# Patient Record
Sex: Female | Born: 1975 | Race: Black or African American | Hispanic: No | Marital: Married | Smoking: Never smoker
Health system: Southern US, Community
[De-identification: ages and names within clinical notes are randomized; demographics above are authoritative.]

## PROBLEM LIST (undated history)

## (undated) DIAGNOSIS — T7840XA Allergy, unspecified, initial encounter: Secondary | ICD-10-CM

## (undated) HISTORY — DX: Allergy, unspecified, initial encounter: T78.40XA

---

## 2001-05-31 ENCOUNTER — Ambulatory Visit (HOSPITAL_COMMUNITY): Admission: RE | Admit: 2001-05-31 | Discharge: 2001-05-31 | Payer: Self-pay | Admitting: Obstetrics

## 2001-07-27 ENCOUNTER — Ambulatory Visit (HOSPITAL_COMMUNITY): Admission: RE | Admit: 2001-07-27 | Discharge: 2001-07-27 | Payer: Self-pay | Admitting: *Deleted

## 2001-07-27 ENCOUNTER — Encounter: Payer: Self-pay | Admitting: *Deleted

## 2001-10-15 ENCOUNTER — Encounter (INDEPENDENT_AMBULATORY_CARE_PROVIDER_SITE_OTHER): Payer: Self-pay | Admitting: *Deleted

## 2001-10-15 ENCOUNTER — Inpatient Hospital Stay (HOSPITAL_COMMUNITY): Admission: AD | Admit: 2001-10-15 | Discharge: 2001-10-17 | Payer: Self-pay | Admitting: *Deleted

## 2006-02-20 ENCOUNTER — Ambulatory Visit (HOSPITAL_COMMUNITY): Admission: RE | Admit: 2006-02-20 | Discharge: 2006-02-20 | Payer: Self-pay | Admitting: Obstetrics & Gynecology

## 2006-03-06 ENCOUNTER — Ambulatory Visit (HOSPITAL_COMMUNITY): Admission: RE | Admit: 2006-03-06 | Discharge: 2006-03-06 | Payer: Self-pay | Admitting: Obstetrics & Gynecology

## 2006-08-06 ENCOUNTER — Ambulatory Visit: Payer: Self-pay | Admitting: *Deleted

## 2006-08-06 ENCOUNTER — Inpatient Hospital Stay (HOSPITAL_COMMUNITY): Admission: AD | Admit: 2006-08-06 | Discharge: 2006-08-08 | Payer: Self-pay | Admitting: Obstetrics and Gynecology

## 2006-08-14 ENCOUNTER — Ambulatory Visit: Payer: Self-pay | Admitting: *Deleted

## 2006-08-14 ENCOUNTER — Inpatient Hospital Stay (HOSPITAL_COMMUNITY): Admission: AD | Admit: 2006-08-14 | Discharge: 2006-08-14 | Payer: Self-pay | Admitting: Obstetrics & Gynecology

## 2006-08-17 ENCOUNTER — Inpatient Hospital Stay (HOSPITAL_COMMUNITY): Admission: AD | Admit: 2006-08-17 | Discharge: 2006-08-18 | Payer: Self-pay | Admitting: Obstetrics & Gynecology

## 2006-08-17 ENCOUNTER — Ambulatory Visit: Payer: Self-pay | Admitting: Obstetrics and Gynecology

## 2007-07-30 ENCOUNTER — Emergency Department (HOSPITAL_COMMUNITY): Admission: EM | Admit: 2007-07-30 | Discharge: 2007-07-30 | Payer: Self-pay | Admitting: Family Medicine

## 2007-08-02 ENCOUNTER — Ambulatory Visit: Payer: Self-pay | Admitting: *Deleted

## 2007-09-01 ENCOUNTER — Encounter (INDEPENDENT_AMBULATORY_CARE_PROVIDER_SITE_OTHER): Payer: Self-pay | Admitting: Nurse Practitioner

## 2007-09-01 ENCOUNTER — Ambulatory Visit: Payer: Self-pay | Admitting: Internal Medicine

## 2007-09-01 LAB — CONVERTED CEMR LAB
ALT: <8 units/L (ref 0–35)
AST: 17 units/L (ref 0–37)
Albumin: 4.3 g/dL (ref 3.5–5.2)
Alkaline Phosphatase: 73 units/L (ref 39–117)
BUN: 8 mg/dL (ref 6–23)
Basophils Absolute: 0 10*3/uL (ref 0.0–0.1)
Basophils Relative: 0 % (ref 0–1)
CO2: 23 meq/L (ref 19–32)
Calcium: 8.9 mg/dL (ref 8.4–10.5)
Chloride: 101 meq/L (ref 96–112)
Creatinine, Ser: 0.61 mg/dL (ref 0.40–1.20)
Eosinophils Absolute: 0.1 10*3/uL (ref 0.0–0.7)
Eosinophils Relative: 1 % (ref 0–5)
Glucose, Bld: 84 mg/dL (ref 70–99)
HCT: 42.3 % (ref 36.0–46.0)
Hemoglobin: 13.9 g/dL (ref 12.0–15.0)
Lymphocytes Relative: 50 % — ABNORMAL HIGH (ref 12–46)
Lymphs Abs: 3.1 10*3/uL (ref 0.7–4.0)
MCHC: 32.9 g/dL (ref 30.0–36.0)
MCV: 87.4 fL (ref 78.0–100.0)
Monocytes Absolute: 0.5 10*3/uL (ref 0.1–1.0)
Monocytes Relative: 7 % (ref 3–12)
Neutro Abs: 2.6 10*3/uL (ref 1.7–7.7)
Neutrophils Relative %: 42 % — ABNORMAL LOW (ref 43–77)
Platelets: 267 10*3/uL (ref 150–400)
Potassium: 3.9 meq/L (ref 3.5–5.3)
RBC: 4.84 M/uL (ref 3.87–5.11)
RDW: 13.8 % (ref 11.5–15.5)
Sodium: 138 meq/L (ref 135–145)
TSH: 0.747 microintl units/mL (ref 0.350–5.50)
Total Bilirubin: 0.8 mg/dL (ref 0.3–1.2)
Total Protein: 7.8 g/dL (ref 6.0–8.3)
WBC: 6.2 10*3/uL (ref 4.0–10.5)

## 2008-06-04 ENCOUNTER — Emergency Department (HOSPITAL_COMMUNITY): Admission: EM | Admit: 2008-06-04 | Discharge: 2008-06-04 | Payer: Self-pay | Admitting: Family Medicine

## 2010-06-20 ENCOUNTER — Inpatient Hospital Stay (INDEPENDENT_AMBULATORY_CARE_PROVIDER_SITE_OTHER)
Admission: RE | Admit: 2010-06-20 | Discharge: 2010-06-20 | Disposition: A | Discharge status: Home / Self Care | Payer: Self-pay | Source: Ambulatory Visit | Attending: Family Medicine | Admitting: Family Medicine

## 2010-06-20 DIAGNOSIS — I889 Nonspecific lymphadenitis, unspecified: Secondary | ICD-10-CM

## 2010-06-20 LAB — DIFFERENTIAL
Eosinophils Absolute: 0.2 10*3/uL (ref 0.0–0.7)
Eosinophils Relative: 2 % (ref 0–5)
Lymphs Abs: 3.7 10*3/uL (ref 0.7–4.0)
Monocytes Relative: 6 % (ref 3–12)

## 2010-06-20 LAB — CBC
HCT: 40.5 % (ref 36.0–46.0)
MCH: 28.7 pg (ref 26.0–34.0)
MCV: 87.3 fL (ref 78.0–100.0)
Platelets: 283 10*3/uL (ref 150–400)
RDW: 13.9 % (ref 11.5–15.5)

## 2010-06-20 LAB — POCT I-STAT, CHEM 8
BUN: 10 mg/dL (ref 6–23)
Calcium, Ion: 1.14 mmol/L (ref 1.12–1.32)
Chloride: 105 mEq/L (ref 96–112)
Creatinine, Ser: 0.7 mg/dL (ref 0.4–1.2)
Glucose, Bld: 80 mg/dL (ref 70–99)
HCT: 42 % (ref 36.0–46.0)
Hemoglobin: 14.3 g/dL (ref 12.0–15.0)
Potassium: 4 mEq/L (ref 3.5–5.1)
Sodium: 139 mEq/L (ref 135–145)
TCO2: 22 mmol/L (ref 0–100)

## 2011-04-04 ENCOUNTER — Emergency Department (INDEPENDENT_AMBULATORY_CARE_PROVIDER_SITE_OTHER)
Admission: EM | Admit: 2011-04-04 | Discharge: 2011-04-04 | Disposition: A | Discharge status: Home / Self Care | Payer: Self-pay | Source: Home / Self Care | Attending: Family Medicine | Admitting: Family Medicine

## 2011-04-04 DIAGNOSIS — L723 Sebaceous cyst: Secondary | ICD-10-CM

## 2011-04-04 NOTE — ED Notes (Signed)
C/o "bump" to lower abdomen for a year or more- states last night it started draining.  Denies pain or fever

## 2011-04-04 NOTE — ED Provider Notes (Signed)
History     CSN: [id number] Arrival date & time: 04/04/2011 10:49 AM   First MD Initiated Contact with Patient 04/04/11 1017      Chief Complaint  Patient presents with  . Rash    (Consider location/radiation/quality/duration/timing/severity/associated sxs/prior treatment) HPI Comments: Meghan [name] presents for evaluation of a "bump" on her lower abdomen, that began oozing brownish material last night. She reports that it has been there for a year or more. It is mildly tender. She denies any injury.  Patient is a 35 y.o. female presenting with rash.  Rash  This is a chronic problem. The current episode started more than 1 week ago. The problem has not changed since onset.The problem is associated with nothing. There has been no fever. The rash is present on the abdomen. The patient is experiencing no pain. She has tried nothing for the symptoms.    History reviewed. No pertinent past medical history.  History reviewed. No pertinent past surgical history.  No family history on file.  History  Substance Use Topics  . Smoking status: Never Smoker   . Smokeless tobacco: Not on file  . Alcohol Use: No    OB History    Grav Para Term Preterm Abortions TAB SAB Ect Mult Living                  Review of Systems  Constitutional: Negative.   HENT: Negative.   Eyes: Negative.   Respiratory: Negative.   Cardiovascular: Negative.   Gastrointestinal: Negative.   Genitourinary: Negative.   Musculoskeletal: Negative.   Skin: Negative for rash and wound.       Small 2 cm indurated lesion over lower abdomen with central closed comedone  Neurological: Negative.     Allergies  Review of patient's allergies indicates no known allergies.  Home Medications   Current Outpatient Rx  Name Route Sig Dispense Refill  . PRESCRIPTION MEDICATION  Birth Control Pills       BP 115/78  Pulse 78  Temp(Src) 98.8 F (37.1 C) (Oral)  Resp 16  SpO2 98%  LMP 03/20/2011  Physical Exam    Nursing note and vitals reviewed. Constitutional: She is oriented to person, place, and time. She appears well-developed and well-nourished.  HENT:  Head: Normocephalic and atraumatic.  Eyes: EOM are normal.  Neck: Normal range of motion.  Abdominal: Soft. Normal appearance and bowel sounds are normal. There is no tenderness.    Neurological: She is alert and oriented to person, place, and time.  Skin: Skin is warm and dry. Lesion and rash noted. Rash is papular.       ED Course  Procedures (including critical care time)  Labs Reviewed - No data to display No results found.   1. Sebaceous cyst       MDM          Richardo Priest, MD 04/04/11 1154

## 2012-04-22 ENCOUNTER — Encounter (HOSPITAL_COMMUNITY): Payer: Self-pay | Admitting: Emergency Medicine

## 2012-04-22 ENCOUNTER — Emergency Department (INDEPENDENT_AMBULATORY_CARE_PROVIDER_SITE_OTHER)
Admission: EM | Admit: 2012-04-22 | Discharge: 2012-04-22 | Disposition: A | Discharge status: Home / Self Care | Payer: Self-pay | Source: Home / Self Care

## 2012-04-22 DIAGNOSIS — L259 Unspecified contact dermatitis, unspecified cause: Secondary | ICD-10-CM

## 2012-04-22 DIAGNOSIS — L309 Dermatitis, unspecified: Secondary | ICD-10-CM

## 2012-04-22 MED ORDER — TRIAMCINOLONE ACETONIDE 0.1 % EX CREA: TOPICAL_CREAM | Freq: Two times a day (BID) | CUTANEOUS | Status: DC

## 2012-04-22 MED ORDER — CLINDAMYCIN PHOSPHATE 1 % EX GEL: Freq: Two times a day (BID) | CUTANEOUS | Status: DC

## 2012-04-22 NOTE — ED Provider Notes (Signed)
Medical screening examination/treatment/procedure(s) were performed by non-physician practitioner and as supervising physician I was immediately available for consultation/collaboration.   MORENO-COLL,Jalah Warmuth; MD   Zaylia Riolo Moreno-Coll, MD 04/22/12 1513 

## 2012-04-22 NOTE — ED Provider Notes (Signed)
History     CSN: [id number]  Arrival date & time 04/22/12  1133   None     Chief Complaint  Patient presents with  . Fever  . Rash    (Consider location/radiation/quality/duration/timing/severity/associated sxs/prior treatment) HPI Comments: This 36 year old Arabic woman presents with a rash on her face that developed last week. She states this rash is tingling, burning and sensitive. It is not located in other areas besides the face. The rash is  reddish papular and distributed about the face and adjacent to the lower eyelashes. 2 days ago she had a low-grade fever but has not had one since. She denies earache or sore throat. Denies shortness of breath, GI or GU symptoms.  Patient is a 36 y.o. female presenting with fever and rash.  Fever Primary symptoms of the febrile illness include fever and rash. Primary symptoms do not include fatigue.  Rash     History reviewed. No pertinent past medical history.  History reviewed. No pertinent past surgical history.  History reviewed. No pertinent family history.  History  Substance Use Topics  . Smoking status: Never Smoker   . Smokeless tobacco: Not on file  . Alcohol Use: No    OB History    Grav Para Term Preterm Abortions TAB SAB Ect Mult Living                  Review of Systems  Constitutional: Positive for fever. Negative for chills, activity change and fatigue.  HENT: Negative for ear pain, sore throat, facial swelling, rhinorrhea, neck pain and postnasal drip.   Gastrointestinal: Negative.   Genitourinary: Negative.   Skin: Positive for rash.       The history of present illness    Allergies  Review of patient's allergies indicates no known allergies.  Home Medications   Current Outpatient Rx  Name  Route  Sig  Dispense  Refill  . CLINDAMYCIN PHOSPHATE 1 % EX GEL   Topical   Apply topically 2 (two) times daily.   30 g   0   . PRESCRIPTION MEDICATION      Birth Control Pills          .  TRIAMCINOLONE ACETONIDE 0.1 % EX CREA   Topical   Apply topically 2 (two) times daily. Apply for 2 weeks. May use on face   30 g   0     BP 123/72  Pulse 58  Temp 98 F (36.7 C) (Oral)  Resp 16  SpO2 98%  Physical Exam  Nursing note and vitals reviewed. Constitutional: She is oriented to person, place, and time. She appears well-developed and well-nourished. No distress.  HENT:       Bilateral TMs are normal Oropharynx is moist and clear with no exudates, erythema or PND .  Eyes: Conjunctivae normal are normal.       As stated in history of present illness there are small papules bordering the lower eyelashes.  Neck: Normal range of motion. Neck supple.  Cardiovascular: Normal rate and normal heart sounds.   Pulmonary/Chest: Effort normal.  Neurological: She is alert and oriented to person, place, and time.  Skin: Skin is warm and dry. Rash noted.  Psychiatric: She has a normal mood and affect.    ED Course  Procedures (including critical care time)  Labs Reviewed - No data to display No results found.   1. Dermatitis   2. Eczema of face       MDM   This  type of rash in distribution appears most like eczema. There are no systemic symptoms to suggest infection. Another consideration is that of an acne type rash possibly superimposed therefore I did had the clindamycin gel. Triamcinolone 0.1% twice a day to the face. Cool compresses and instructions on care and treatment of eczema toward rashes. Recheck promptly for any new symptoms problems or worsening patient is stable and         Hayden Rasmussen, NP 04/22/12 1357

## 2012-04-22 NOTE — ED Notes (Addendum)
Reports fever and rash on face.  Rash has been on face for a couple of days.  No new product use.  No medication or creams used.  The rash does itch.

## 2013-06-16 ENCOUNTER — Ambulatory Visit (INDEPENDENT_AMBULATORY_CARE_PROVIDER_SITE_OTHER): Payer: No Typology Code available for payment source | Admitting: Family Medicine

## 2013-06-16 VITALS — BP 106/62 | HR 76 | Temp 97.9°F | Resp 16 | Ht 64.0 in | Wt 145.0 lb

## 2013-06-16 DIAGNOSIS — Z0000 Encounter for general adult medical examination without abnormal findings: Secondary | ICD-10-CM

## 2013-06-16 DIAGNOSIS — Z23 Encounter for immunization: Secondary | ICD-10-CM

## 2013-06-16 LAB — LIPID PANEL
CHOL/HDL RATIO: 3.3 ratio
CHOLESTEROL: 157 mg/dL (ref 0–200)
HDL: 47 mg/dL (ref >39)
LDL Cholesterol: 91 mg/dL (ref 0–99)
Triglycerides: 95 mg/dL (ref <150)
VLDL: 19 mg/dL (ref 0–40)

## 2013-06-16 LAB — COMPREHENSIVE METABOLIC PANEL WITH GFR
ALT: 8 U/L (ref 0–35)
AST: 13 U/L (ref 0–37)
Albumin: 3.8 g/dL (ref 3.5–5.2)
Alkaline Phosphatase: 52 U/L (ref 39–117)
BUN: 8 mg/dL (ref 6–23)
CO2: 26 meq/L (ref 19–32)
Calcium: 8.7 mg/dL (ref 8.4–10.5)
Chloride: 103 meq/L (ref 96–112)
Creat: 0.61 mg/dL (ref 0.50–1.10)
Glucose, Bld: 68 mg/dL — ABNORMAL LOW (ref 70–99)
Potassium: 4 meq/L (ref 3.5–5.3)
Sodium: 138 meq/L (ref 135–145)
Total Bilirubin: 0.6 mg/dL (ref 0.2–1.2)
Total Protein: 7.2 g/dL (ref 6.0–8.3)

## 2013-06-16 LAB — CBC
HCT: 38.5 % (ref 36.0–46.0)
HEMOGLOBIN: 13.2 g/dL (ref 12.0–15.0)
MCH: 28.9 pg (ref 26.0–34.0)
MCHC: 34.3 g/dL (ref 30.0–36.0)
MCV: 84.4 fL (ref 78.0–100.0)
PLATELETS: 332 10*3/uL (ref 150–400)
RBC: 4.56 MIL/uL (ref 3.87–5.11)
RDW: 13.8 % (ref 11.5–15.5)
WBC: 6.2 10*3/uL (ref 4.0–10.5)

## 2013-06-16 NOTE — Patient Instructions (Signed)
Good to see you today- I will be in touch regarding your labs when they come in.  It is time for your first mammogram after you turn 38- this can be done at several locations in town at your convenience.  Remember to have a pap in the next year.    Try the [facility] clinic for help with your veins Address: [address] [place], [place] [address] Telephone: [place] [phone]

## 2013-06-16 NOTE — Progress Notes (Signed)
[facility] [address], [place] [address] [phone]  Date:  06/16/2013   Name:  Meghan [name]   DOB:  05/16/1975   MRN:  [id number]  PCP:  Per Patient No Pcp    Chief Complaint: Annual Exam   History of Present Illness:  Meghan [name] is a 38 y.o. very pleasant female patient who presents with the following:  Here as a new patient today for an annual exam.  She needs this for her insurance.  She is not aware of any health problems. She is fasting today for labs.  She did have a pap in 2013 at another clinic. She does not wish to have a pap today  Sometiems she may have a pain in her legs-  She has noted this for some time and relates it to spider veins in her legs  She is on OCP and uses them continuously; unsure the exact date of her last menses  There are no active problems to display for this patient.   No past medical history on file.  No past surgical history on file.  History  Substance Use Topics  . Smoking status: Never Smoker   . Smokeless tobacco: Not on file  . Alcohol Use: No    No family history on file.  No Known Allergies  Medication list has been reviewed and updated.  Current Outpatient Prescriptions on File Prior to Visit  Medication Sig Dispense Refill  . PRESCRIPTION MEDICATION Birth Control Pills       . clindamycin (CLINDAGEL) 1 % gel Apply topically 2 (two) times daily.  30 g  0  . triamcinolone cream (KENALOG) 0.1 % Apply topically 2 (two) times daily. Apply for 2 weeks. May use on face  30 g  0   No current facility-administered medications on file prior to visit.    Review of Systems:  As per HPI- otherwise negative.   Physical Examination: Filed Vitals:   06/16/13 1211  BP: 106/62  Pulse: 76  Temp: 97.9 F (36.6 C)  Resp: 16   Filed Vitals:   06/16/13 1211  Height: 5\' 4"  (1.626 m)  Weight: 145 lb (65.772 kg)   Body mass index is 24.88 kg/(m^2). Ideal Body Weight: Weight in (lb) to  have BMI = 25: 145.3  GEN: WDWN, NAD, Non-toxic, A & O x 3, looks well HEENT: Atraumatic, Normocephalic. Neck supple. No masses, No LAD.  Bilateral TM wnl, oropharynx normal.  PEERL,EOMI.  Ears and Nose: No external deformity. CV: RRR, No M/G/R. No JVD. No thrill. No extra heart sounds. PULM: CTA B, no wheezes, crackles, rhonchi. No retractions. No resp. distress. No accessory muscle use. ABD: S, NT, ND. No rebound. No HSM. EXTR: No c/c/e NEURO Normal gait.  PSYCH: Normally interactive. Conversant. Not depressed or anxious appearing.  Calm demeanor.    Assessment and Plan: Physical exam - Plan: CBC, Comprehensive metabolic panel, Lipid panel, Tdap vaccine greater than or equal to 7yo IM, TSH  CPE for insurance today.  Labs pending as above- Will plan further follow- up pending labs. tdap today See patient instructions for more details.     Signed Abbe AmsterdamJessica Copland, MD

## 2013-06-17 ENCOUNTER — Encounter: Payer: Self-pay | Admitting: Family Medicine

## 2013-06-17 LAB — TSH: TSH: 0.526 u[IU]/mL (ref 0.350–4.500)

## 2014-04-19 ENCOUNTER — Ambulatory Visit (INDEPENDENT_AMBULATORY_CARE_PROVIDER_SITE_OTHER): Payer: No Typology Code available for payment source | Admitting: Emergency Medicine

## 2014-04-19 VITALS — BP 106/68 | HR 74 | Temp 98.3°F | Resp 16 | Ht 63.75 in | Wt 153.4 lb

## 2014-04-19 DIAGNOSIS — H0013 Chalazion right eye, unspecified eyelid: Secondary | ICD-10-CM

## 2014-04-19 DIAGNOSIS — J0140 Acute pansinusitis, unspecified: Secondary | ICD-10-CM

## 2014-04-19 MED ORDER — AMOXICILLIN 875 MG PO TABS: 875.0000 mg | ORAL_TABLET | Freq: Two times a day (BID) | ORAL | Status: DC

## 2014-04-19 MED ORDER — SULFACETAMIDE SODIUM 10 % OP SOLN: 1.0000 [drp] | OPHTHALMIC | Status: DC

## 2014-04-19 MED ORDER — PSEUDOEPHEDRINE-GUAIFENESIN ER 60-600 MG PO TB12: 1.0000 | ORAL_TABLET | Freq: Two times a day (BID) | ORAL | Status: DC

## 2014-04-19 NOTE — Patient Instructions (Signed)

## 2014-04-19 NOTE — Progress Notes (Signed)
[facility] [address], [place] [address] [phone]  Date:  04/19/2014   Name:  Meghan Altesahani H Hawks   DOB:  06/02/1975   MRN:  [id number]  PCP:  Per Patient No Pcp    Chief Complaint: Nasal Congestion; Facial Pain; and Eye Pain   History of Present Illness:  Meghan [name] is a 38 y.o. very pleasant female patient who presents with the following:  Has a pressure in her maxillary sinuses for a week.  No drainage but post nasal draiange that she does not view No sore throat. Cough with no sputum production, wheezing or shortness of breath No fever or chills Says congestion is causing her to awaken in the night due to an inability to breath. No improvement with over the counter medications or other home remedies.  Denies other complaint or health concern today.    There are no active problems to display for this patient.   History reviewed. No pertinent past medical history.  History reviewed. No pertinent past surgical history.  History  Substance Use Topics  . Smoking status: Never Smoker   . Smokeless tobacco: Not on file  . Alcohol Use: No    History reviewed. No pertinent family history.  No Known Allergies  Medication list has been reviewed and updated.  Current Outpatient Prescriptions on File Prior to Visit  Medication Sig Dispense Refill  . PRESCRIPTION MEDICATION Birth Control Pills     . triamcinolone cream (KENALOG) 0.1 % Apply topically 2 (two) times daily. Apply for 2 weeks. May use on face (Patient not taking: Reported on 04/19/2014) 30 g 0   No current facility-administered medications on file prior to visit.    Review of Systems:  As per HPI, otherwise negative.    Physical Examination: Filed Vitals:   04/19/14 1322  BP: 106/68  Pulse: 74  Temp: 98.3 F (36.8 C)  Resp: 16   Filed Vitals:   04/19/14 1322  Height: 5' 3.75" (1.619 m)  Weight: 153 lb 6.4 oz (69.582 kg)   Body mass index is 26.55  kg/(m^2). Ideal Body Weight: Weight in (lb) to have BMI = 25: 144.2  GEN: WDWN, NAD, Non-toxic, A & O x 3 HEENT: Atraumatic, Normocephalic. Neck supple. No masses, No LAD. Ears and Nose: No external deformity. CV: RRR, No M/G/R. No JVD. No thrill. No extra heart sounds. PULM: CTA B, no wheezes, crackles, rhonchi. No retractions. No resp. distress. No accessory muscle use. ABD: S, NT, ND, +BS. No rebound. No HSM. EXTR: No c/c/e NEURO Normal gait.  PSYCH: Normally interactive. Conversant. Not depressed or anxious appearing.  Calm demeanor.    Assessment and Plan: Sinusitis amox mucinex d   Signed,  Phillips OdorJeffery Kiandra Sanguinetti, MD

## 2014-06-02 ENCOUNTER — Other Ambulatory Visit: Payer: Self-pay

## 2014-06-02 NOTE — Telephone Encounter (Signed)
Dr Patsy Lageropland, you saw pt 06/2013 for a CPE and discussed this, but we haven't Rxd for pt before that I can see. Do you want to give her any RFs? I have pended it for 1 mos w/note to RTC. I don't know why it is the med list the way it is, as "Prescripttion Medication, Birth Control Pills"? I called her pharm and they filled it with "Aranelle". I have pended it with this brand, sig was take 1 daily.

## 2014-06-02 NOTE — Telephone Encounter (Signed)
Pt calling to request a refill on birth control. CB # [phone]

## 2014-06-03 MED ORDER — NORETHIN-ETH ESTRAD TRIPHASIC 0.5/1/0.5-35 MG-MCG PO TABS: 1.0000 | ORAL_TABLET | Freq: Every day | ORAL | Status: DC

## 2014-07-14 ENCOUNTER — Ambulatory Visit (INDEPENDENT_AMBULATORY_CARE_PROVIDER_SITE_OTHER): Payer: 59 | Admitting: Emergency Medicine

## 2014-07-14 ENCOUNTER — Other Ambulatory Visit: Payer: Self-pay | Admitting: Family Medicine

## 2014-07-14 VITALS — BP 110/70 | HR 88 | Temp 98.3°F | Resp 16 | Ht 64.0 in | Wt 157.0 lb

## 2014-07-14 DIAGNOSIS — Z3041 Encounter for surveillance of contraceptive pills: Secondary | ICD-10-CM | POA: Diagnosis not present

## 2014-07-14 MED ORDER — NORETHIN-ETH ESTRAD TRIPHASIC 0.5/1/0.5-35 MG-MCG PO TABS: 1.0000 | ORAL_TABLET | Freq: Every day | ORAL | Status: DC

## 2014-07-14 NOTE — Progress Notes (Signed)
[facility] [address], [place] [address] [phone]- [phone]  Date:  07/14/2014   Name:  Meghan [name]   DOB:  08/21/1975   MRN:  [id number]  PCP:  Per Patient No Pcp    Chief Complaint: Medication Refill   History of Present Illness:  Meghan [name] is a 39 y.o. very pleasant female patient who presents with the following:  Comes for renewal of her OCP. Has tolerated the medication well with no adverse effects. Currently on menses.   Denies other complaint or health concern today.    There are no active problems to display for this patient.   History reviewed. No pertinent past medical history.  History reviewed. No pertinent past surgical history.  History  Substance Use Topics  . Smoking status: Never Smoker   . Smokeless tobacco: Not on file  . Alcohol Use: No    History reviewed. No pertinent family history.  No Known Allergies  Medication list has been reviewed and updated.  Current Outpatient Prescriptions on File Prior to Visit  Medication Sig Dispense Refill  . Norethindrone-Ethinyl Estradiol Triphasic (ARANELLE) 0.5/1/0.5-35 MG-MCG tablet Take 1 tablet by mouth daily. NO MORE REFILLS WITHOUT OFFICE VISIT - 2ND NOTICE 28 tablet 0  . sulfacetamide (BLEPH-10) 10 % ophthalmic solution Place 1 drop into the right eye every 4 (four) hours. 15 mL 0  . triamcinolone cream (KENALOG) 0.1 % Apply topically 2 (two) times daily. Apply for 2 weeks. May use on face (Patient not taking: Reported on 04/19/2014) 30 g 0   No current facility-administered medications on file prior to visit.    Review of Systems:  As per HPI, otherwise negative.    Physical Examination: Filed Vitals:   07/14/14 1734  BP: 110/70  Pulse: 88  Temp: 98.3 F (36.8 C)  Resp: 16   Filed Vitals:   07/14/14 1734  Height: 5\' 4"  (1.626 m)  Weight: 157 lb (71.215 kg)   Body mass index is 26.94 kg/(m^2). Ideal Body Weight: Weight in (lb) to have BMI = 25:  145.3   GEN: WDWN, NAD, Non-toxic, Alert & Oriented x 3 HEENT: Atraumatic, Normocephalic.  Ears and Nose: No external deformity. EXTR: No clubbing/cyanosis/edema NEURO: Normal gait.  PSYCH: Normally interactive. Conversant. Not depressed or anxious appearing.  Calm demeanor.  ABD:  Soft, not tender.  Assessment and Plan: OCP management Refill  Signed,  Phillips OdorJeffery Kealohilani Maiorino, MD

## 2014-07-14 NOTE — Patient Instructions (Signed)
Contraception Choices Contraception (birth control) is the use of any methods or devices to prevent pregnancy. Below are some methods to help avoid pregnancy. HORMONAL METHODS   Contraceptive implant. This is a thin, plastic tube containing progesterone hormone. It does not contain estrogen hormone. Your health care provider inserts the tube in the inner part of the upper arm. The tube can remain in place for up to 3 years. After 3 years, the implant must be removed. The implant prevents the ovaries from releasing an egg (ovulation), thickens the cervical mucus to prevent sperm from entering the uterus, and thins the lining of the inside of the uterus.  Progesterone-only injections. These injections are given every 3 months by your health care provider to prevent pregnancy. This synthetic progesterone hormone stops the ovaries from releasing eggs. It also thickens cervical mucus and changes the uterine lining. This makes it harder for sperm to survive in the uterus.  Birth control pills. These pills contain estrogen and progesterone hormone. They work by preventing the ovaries from releasing eggs (ovulation). They also cause the cervical mucus to thicken, preventing the sperm from entering the uterus. Birth control pills are prescribed by a health care provider.Birth control pills can also be used to treat heavy periods.  Minipill. This type of birth control pill contains only the progesterone hormone. They are taken every day of each month and must be prescribed by your health care provider.  Birth control patch. The patch contains hormones similar to those in birth control pills. It must be changed once a week and is prescribed by a health care provider.  Vaginal ring. The ring contains hormones similar to those in birth control pills. It is left in the vagina for 3 weeks, removed for 1 week, and then a new one is put back in place. The patient must be comfortable inserting and removing the ring  from the vagina.A health care provider's prescription is necessary.  Emergency contraception. Emergency contraceptives prevent pregnancy after unprotected sexual intercourse. This pill can be taken right after sex or up to 5 days after unprotected sex. It is most effective the sooner you take the pills after having sexual intercourse. Most emergency contraceptive pills are available without a prescription. Check with your pharmacist. Do not use emergency contraception as your only form of birth control. BARRIER METHODS   Female condom. This is a thin sheath (latex or rubber) that is worn over the penis during sexual intercourse. It can be used with spermicide to increase effectiveness.  Female condom. This is a soft, loose-fitting sheath that is put into the vagina before sexual intercourse.  Diaphragm. This is a soft, latex, dome-shaped barrier that must be fitted by a health care provider. It is inserted into the vagina, along with a spermicidal jelly. It is inserted before intercourse. The diaphragm should be left in the vagina for 6 to 8 hours after intercourse.  Cervical cap. This is a round, soft, latex or plastic cup that fits over the cervix and must be fitted by a health care provider. The cap can be left in place for up to 48 hours after intercourse.  Sponge. This is a soft, circular piece of polyurethane foam. The sponge has spermicide in it. It is inserted into the vagina after wetting it and before sexual intercourse.  Spermicides. These are chemicals that kill or block sperm from entering the cervix and uterus. They come in the form of creams, jellies, suppositories, foam, or tablets. They do not require a   prescription. They are inserted into the vagina with an applicator before having sexual intercourse. The process must be repeated every time you have sexual intercourse. INTRAUTERINE CONTRACEPTION  Intrauterine device (IUD). This is a T-shaped device that is put in a woman's uterus  during a menstrual period to prevent pregnancy. There are 2 types:  Copper IUD. This type of IUD is wrapped in copper wire and is placed inside the uterus. Copper makes the uterus and fallopian tubes produce a fluid that kills sperm. It can stay in place for 10 years.  Hormone IUD. This type of IUD contains the hormone progestin (synthetic progesterone). The hormone thickens the cervical mucus and prevents sperm from entering the uterus, and it also thins the uterine lining to prevent implantation of a fertilized egg. The hormone can weaken or kill the sperm that get into the uterus. It can stay in place for 3-5 years, depending on which type of IUD is used. PERMANENT METHODS OF CONTRACEPTION  Female tubal ligation. This is when the woman's fallopian tubes are surgically sealed, tied, or blocked to prevent the egg from traveling to the uterus.  Hysteroscopic sterilization. This involves placing a small coil or insert into each fallopian tube. Your doctor uses a technique called hysteroscopy to do the procedure. The device causes scar tissue to form. This results in permanent blockage of the fallopian tubes, so the sperm cannot fertilize the egg. It takes about 3 months after the procedure for the tubes to become blocked. You must use another form of birth control for these 3 months.  Female sterilization. This is when the female has the tubes that carry sperm tied off (vasectomy).This blocks sperm from entering the vagina during sexual intercourse. After the procedure, the man can still ejaculate fluid (semen). NATURAL PLANNING METHODS  Natural family planning. This is not having sexual intercourse or using a barrier method (condom, diaphragm, cervical cap) on days the woman could become pregnant.  Calendar method. This is keeping track of the length of each menstrual cycle and identifying when you are fertile.  Ovulation method. This is avoiding sexual intercourse during ovulation.  Symptothermal  method. This is avoiding sexual intercourse during ovulation, using a thermometer and ovulation symptoms.  Post-ovulation method. This is timing sexual intercourse after you have ovulated. Regardless of which type or method of contraception you choose, it is important that you use condoms to protect against the transmission of sexually transmitted infections (STIs). Talk with your health care provider about which form of contraception is most appropriate for you. Document Released: 04/21/2005 Document Revised: 04/26/2013 Document Reviewed: 10/14/2012 Exit[employer] Patient Information 2015 [employer]. This information is not intended to replace advice given to you by your health care provider. Make sure you discuss any questions you have with your health care provider.  

## 2014-08-07 ENCOUNTER — Other Ambulatory Visit: Payer: Self-pay | Admitting: Family Medicine

## 2014-08-13 ENCOUNTER — Other Ambulatory Visit: Payer: Self-pay | Admitting: Family Medicine

## 2014-08-13 ENCOUNTER — Telehealth: Payer: Self-pay

## 2014-08-13 MED ORDER — NORETHIN-ETH ESTRAD TRIPHASIC 0.5/1/0.5-35 MG-MCG PO TABS: 1.0000 | ORAL_TABLET | Freq: Every day | ORAL | Status: DC

## 2014-08-13 NOTE — Addendum Note (Signed)
Addended by: Isaac BlissGALLOWAY, Juley Giovanetti J on: 08/13/2014 04:04 PM   Modules accepted: Orders

## 2014-08-13 NOTE — Telephone Encounter (Signed)
Patient's husband called again. First message documented at 12:57pm and second message documented at 2:31pm. Calling about a refill on wife's birth control medicine. Please advise[facility] [place]  [phone]

## 2014-08-13 NOTE — Telephone Encounter (Signed)
Patient's husband called to request a refill of her birth control.  I told him that a notice was sent stating that it requires her to have an office visit for refills.  He said she had an office visit on 07/14/14 and feels that that OV should serve as authorization for the refill.  Please advise.  Thank you.  CB#: [phone]

## 2014-08-13 NOTE — Telephone Encounter (Signed)
Husband called again at 3:48pm to check the status of this medication.

## 2014-08-13 NOTE — Telephone Encounter (Signed)
Called pharmacy to verify the Rx order.  [facility] stated they did not have the Rx on file.  I advised them according to our computer it stated it was received.  However, an Rx was sent over to the pharmacy and the pt was called back and advised the pharmacy now has their medication.

## 2015-03-22 ENCOUNTER — Ambulatory Visit (INDEPENDENT_AMBULATORY_CARE_PROVIDER_SITE_OTHER): Payer: 59 | Admitting: Family Medicine

## 2015-03-22 ENCOUNTER — Other Ambulatory Visit: Payer: Self-pay

## 2015-03-22 VITALS — BP 108/64 | HR 74 | Temp 98.4°F | Resp 18 | Ht 64.0 in | Wt 154.0 lb

## 2015-03-22 DIAGNOSIS — Z1329 Encounter for screening for other suspected endocrine disorder: Secondary | ICD-10-CM | POA: Diagnosis not present

## 2015-03-22 DIAGNOSIS — J302 Other seasonal allergic rhinitis: Secondary | ICD-10-CM | POA: Diagnosis not present

## 2015-03-22 DIAGNOSIS — Z130 Encounter for screening for diseases of the blood and blood-forming organs and certain disorders involving the immune mechanism: Secondary | ICD-10-CM | POA: Diagnosis not present

## 2015-03-22 DIAGNOSIS — Z3041 Encounter for surveillance of contraceptive pills: Secondary | ICD-10-CM | POA: Diagnosis not present

## 2015-03-22 DIAGNOSIS — Z131 Encounter for screening for diabetes mellitus: Secondary | ICD-10-CM

## 2015-03-22 DIAGNOSIS — Z1231 Encounter for screening mammogram for malignant neoplasm of breast: Secondary | ICD-10-CM

## 2015-03-22 DIAGNOSIS — Z1239 Encounter for other screening for malignant neoplasm of breast: Secondary | ICD-10-CM | POA: Diagnosis not present

## 2015-03-22 DIAGNOSIS — Z13220 Encounter for screening for lipoid disorders: Secondary | ICD-10-CM | POA: Diagnosis not present

## 2015-03-22 DIAGNOSIS — Z23 Encounter for immunization: Secondary | ICD-10-CM

## 2015-03-22 DIAGNOSIS — Z0000 Encounter for general adult medical examination without abnormal findings: Secondary | ICD-10-CM

## 2015-03-22 MED ORDER — NORETHIN-ETH ESTRAD TRIPHASIC 0.5/1/0.5-35 MG-MCG PO TABS: 1.0000 | ORAL_TABLET | Freq: Every day | ORAL | Status: DC

## 2015-03-22 MED ORDER — FLUTICASONE PROPIONATE 50 MCG/ACT NA SUSP: 2.0000 | Freq: Every day | NASAL | Status: DC

## 2015-03-22 NOTE — Progress Notes (Signed)
[facility] [address], [place] [address] [phone]  Date:  03/22/2015   Name:  Meghan [name]   DOB:  August 08, 1975   MRN:  [id number]  PCP:  Per Patient No Pcp    Chief Complaint: Annual Exam and Flu Vaccine   History of Present Illness:  Meghan [name] is a 39 y.o. very pleasant female patient who presents with the following:  She is here today for a CPE- she requests no pap.  She states this was done by [employer] 2 years ago.  It was normal.  Flu shot today Tetanus UTD She is generally in good health She is fasting today for labs  She has noted some nasal congestion and can have some sinus pressure. They tried some medication OTC - mucinex D.   She has not yet had a mammogram  She is on OCP Neve a smoker   There are no active problems to display for this patient.   Past Medical History  Diagnosis Date  . Allergy     History reviewed. No pertinent past surgical history.  Social History  Substance Use Topics  . Smoking status: Never Smoker   . Smokeless tobacco: None  . Alcohol Use: No    History reviewed. No pertinent family history.  No Known Allergies  Medication list has been reviewed and updated.  Current Outpatient Prescriptions on File Prior to Visit  Medication Sig Dispense Refill  . Norethindrone-Ethinyl Estradiol Triphasic (ARANELLE) 0.5/1/0.5-35 MG-MCG tablet Take 1 tablet by mouth daily. 3 Package 3  . sulfacetamide (BLEPH-10) 10 % ophthalmic solution Place 1 drop into the right eye every 4 (four) hours. (Patient not taking: Reported on 03/22/2015) 15 mL 0  . triamcinolone cream (KENALOG) 0.1 % Apply topically 2 (two) times daily. Apply for 2 weeks. May use on face (Patient not taking: Reported on 04/19/2014) 30 g 0   No current facility-administered medications on file prior to visit.    Review of Systems:  As per HPI- otherwise negative.   Physical Examination: Filed Vitals:   03/22/15 1120  BP: 108/64   Pulse: 74  Temp: 98.4 F (36.9 C)  Resp: 18   Filed Vitals:   03/22/15 1120  Height:  (1.626 m)  Weight: 154 lb (69.854 kg)   Body mass index is 26.42 kg/(m^2). Ideal Body Weight: Weight in (lb) to have BMI = 25: 145.3  GEN: WDWN, NAD, Non-toxic, A & O x 3 HEENT: Atraumatic, Normocephalic. Neck supple. No masses, No LAD.  Bilateral TM wnl, oropharynx normal.  PEERL,EOMI.   Nasal congestion with stringy mucus typical of AR Ears and Nose: No external deformity. CV: RRR, No M/G/R. No JVD. No thrill. No extra heart sounds. PULM: CTA B, no wheezes, crackles, rhonchi. No retractions. No resp. distress. No accessory muscle use. ABD: S, NT, ND EXTR: No c/c/e NEURO Normal gait.  PSYCH: Normally interactive. Conversant. Not depressed or anxious appearing.  Calm demeanor.    Assessment and Plan: Physical exam  Need for prophylactic vaccination and inoculation against influenza - Plan: Flu Vaccine QUAD 36+ mos IM  Encounter for surveillance of contraceptive pills - Plan: Norethindrone-Ethinyl Estradiol Triphasic (ARANELLE) 0.5/1/0.5-35 MG-MCG tablet  Screening for hyperlipidemia - Plan: Lipid panel  Screening for deficiency anemia - Plan: CBC  Screening for diabetes mellitus - Plan: Comprehensive metabolic panel  Screening for thyroid disorder - Plan: TSH  Other seasonal allergic rhinitis - Plan: fluticasone (FLONASE) 50 MCG/ACT nasal spray  Screening  for breast cancer - Plan: MM Digital Screening  Labs pending as above Refilled her OCP Try flonase and OTC claritin as needed for allergy sx Schedule mammo  Signed Abbe AmsterdamJessica Drakkar Medeiros, MD

## 2015-03-22 NOTE — Patient Instructions (Signed)
It was good to see you today Use the nasal spray for your nose, and also buy loratadine 10 mg over the counter and take one daily Let me know if this does not help with your nose symptoms I will be in touch with your labs We will also get you set up for a mammogram (test to screen for breast cancer) - take care!

## 2015-03-23 ENCOUNTER — Encounter: Payer: Self-pay | Admitting: Family Medicine

## 2015-03-23 LAB — COMPREHENSIVE METABOLIC PANEL
ALBUMIN: 3.8 g/dL (ref 3.6–5.1)
ALT: 6 U/L (ref 6–29)
AST: 15 U/L (ref 10–30)
Alkaline Phosphatase: 61 U/L (ref 33–115)
BILIRUBIN TOTAL: 0.6 mg/dL (ref 0.2–1.2)
BUN: 9 mg/dL (ref 7–25)
CO2: 25 mmol/L (ref 20–31)
CREATININE: 0.65 mg/dL (ref 0.50–1.10)
Calcium: 8.7 mg/dL (ref 8.6–10.2)
Chloride: 100 mmol/L (ref 98–110)
GLUCOSE: 75 mg/dL (ref 65–99)
Potassium: 3.7 mmol/L (ref 3.5–5.3)
SODIUM: 136 mmol/L (ref 135–146)
Total Protein: 7.2 g/dL (ref 6.1–8.1)

## 2015-03-23 LAB — TSH: TSH: 0.545 u[IU]/mL (ref 0.350–4.500)

## 2015-03-23 LAB — CBC
HCT: 42.9 % (ref 36.0–46.0)
HEMOGLOBIN: 13.9 g/dL (ref 12.0–15.0)
MCH: 28.3 pg (ref 26.0–34.0)
MCHC: 32.4 g/dL (ref 30.0–36.0)
MCV: 87.4 fL (ref 78.0–100.0)
MPV: 9 fL (ref 8.6–12.4)
PLATELETS: 314 10*3/uL (ref 150–400)
RBC: 4.91 MIL/uL (ref 3.87–5.11)
RDW: 13.4 % (ref 11.5–15.5)
WBC: 6.6 10*3/uL (ref 4.0–10.5)

## 2015-03-23 LAB — LIPID PANEL
Cholesterol: 176 mg/dL (ref 125–200)
HDL: 47 mg/dL (ref >=46)
LDL CALC: 109 mg/dL (ref <130)
TRIGLYCERIDES: 99 mg/dL (ref <150)
Total CHOL/HDL Ratio: 3.7 Ratio (ref <=5.0)
VLDL: 20 mg/dL (ref <30)

## 2015-09-21 ENCOUNTER — Ambulatory Visit
Admission: RE | Admit: 2015-09-21 | Discharge: 2015-09-21 | Disposition: A | Discharge status: Home / Self Care | Payer: BLUE CROSS/BLUE SHIELD | Source: Ambulatory Visit | Attending: Family Medicine | Admitting: Family Medicine

## 2015-09-21 DIAGNOSIS — Z1231 Encounter for screening mammogram for malignant neoplasm of breast: Secondary | ICD-10-CM

## 2016-02-01 ENCOUNTER — Ambulatory Visit (INDEPENDENT_AMBULATORY_CARE_PROVIDER_SITE_OTHER): Payer: BLUE CROSS/BLUE SHIELD | Admitting: Physician Assistant

## 2016-02-01 ENCOUNTER — Telehealth: Payer: Self-pay | Admitting: *Deleted

## 2016-02-01 VITALS — BP 100/62 | HR 86 | Temp 99.0°F | Resp 18 | Ht 64.0 in | Wt 160.0 lb

## 2016-02-01 DIAGNOSIS — Z23 Encounter for immunization: Secondary | ICD-10-CM

## 2016-02-01 DIAGNOSIS — L0291 Cutaneous abscess, unspecified: Secondary | ICD-10-CM

## 2016-02-01 DIAGNOSIS — Z3041 Encounter for surveillance of contraceptive pills: Secondary | ICD-10-CM

## 2016-02-01 DIAGNOSIS — L0390 Cellulitis, unspecified: Secondary | ICD-10-CM

## 2016-02-01 MED ORDER — NORETHIN-ETH ESTRAD TRIPHASIC 0.5/1/0.5-35 MG-MCG PO TABS: 1.0000 | ORAL_TABLET | Freq: Every day | ORAL | 3 refills | Status: DC

## 2016-02-01 MED ORDER — DOXYCYCLINE HYCLATE 100 MG PO CAPS: 100.0000 mg | ORAL_CAPSULE | Freq: Two times a day (BID) | ORAL | 0 refills | Status: DC

## 2016-02-01 MED ORDER — HYDROCODONE-ACETAMINOPHEN 5-325 MG PO TABS: 1.0000 | ORAL_TABLET | Freq: Four times a day (QID) | ORAL | 0 refills | Status: DC | PRN

## 2016-02-01 MED ORDER — DOXYCYCLINE HYCLATE 100 MG PO CAPS: 100.0000 mg | ORAL_CAPSULE | Freq: Two times a day (BID) | ORAL | 0 refills | Status: AC

## 2016-02-01 NOTE — Telephone Encounter (Signed)
Per patient Doxycycline Rx sent to wrong Pharmacy, left message on voicemail not to fill order.

## 2016-02-01 NOTE — Progress Notes (Signed)
[facility] [address], [place] [id number] [phone]  By signing my name below, I, Mesha Guinyard, attest that this documentation has been prepared under the direction and in the presence of [name], PA-C. Electronically Signed: Arvilla MarketMesha Guinyard, Medical Scribe. 02/01/16. 3:39 PM.  Date:  02/01/2016   Name:  Meghan [name]   DOB:  06/27/1975   MRN:  [id number]  PCP:  Per Patient No Pcp (Inactive)   Chief Complaint  Patient presents with   Recurrent Skin Infections    right inner thigh 1 month   Flu Vaccine    History of Present Illness:  Meghan [name] is a 40 y.o. female patient who [facility] complaining of a painful nodule on her right upper thigh onset a month. Pt's native language isn't English so her husband translated for her. Pt reports associated symptoms of drainage today, and "fever inside". Pt has tried OTC abx ointment without relief to her symptoms. Pt denies fever, shaving in the area of concern, and PMHx of boils-including under her axilla.  Pt would like to get a refill on her birthcontrol.  She has not missed any dosing.   There are no active problems to display for this patient.   Past Medical History:  Diagnosis Date   Allergy     No past surgical history on file.  Social History  Substance Use Topics   Smoking status: Never Smoker   Smokeless tobacco: Never Used   Alcohol use No    No family history on file.  No Known Allergies  Medication list has been reviewed and updated.  Current Outpatient Prescriptions on File Prior to Visit  Medication Sig Dispense Refill   fluticasone (FLONASE) 50 MCG/ACT nasal spray Place 2 sprays into both nostrils daily. 16 g 6   Norethindrone-Ethinyl Estradiol Triphasic (ARANELLE) 0.5/1/0.5-35 MG-MCG tablet Take 1 tablet by mouth daily. 3 Package 3   triamcinolone cream (KENALOG) 0.1 % Apply topically 2 (two) times daily. Apply for 2 weeks. May use on face  (Patient not taking: Reported on 02/01/2016) 30 g 0   No current facility-administered medications on file prior to visit.     Review of Systems  Constitutional: Negative for fever.  Skin:       Positive for nodule   Physical Examination: BP 100/62 (BP Location: Right Arm, Patient Position: Sitting, Cuff Size: Small)    Pulse 86    Temp 99 F (37.2 C) (Oral)    Resp 18    Ht 5\' 4"  (1.626 m)    Wt 160 lb (72.6 kg)    LMP  (LMP Unknown)    SpO2 100%    BMI 27.46 kg/m  Ideal Body Weight: @FLOWAMB ([id number])@([phone])@  Physical Exam  Constitutional: She is oriented to person, place, and time. She appears well-developed and well-nourished. No distress.  HENT:  Head: Normocephalic and atraumatic.  Right Ear: External ear normal.  Left Ear: External ear normal.  Eyes: Conjunctivae and EOM are normal. Pupils are equal, round, and reactive to light.  Cardiovascular: Normal rate, regular rhythm and normal heart sounds.  Exam reveals no gallop and no friction rub.   No murmur heard. Pulmonary/Chest: Effort normal and breath sounds normal. No respiratory distress. She has no wheezes. She has no rales.  Neurological: She is alert and oriented to person, place, and time.  Skin: She is not diaphoretic.  2 erythematous fluctuant swelling along her right buttock just lateral to the posterior introitus Drainage  from one of the indurated nodules with purulent fluid  Psychiatric: She has a normal mood and affect. Her behavior is normal.   I&D Procedure: Verbal consent obtained. 2% lidocaine applied to procedure site for procedure Swabbed with polvadine 11 blade utilized to placed 2 cm incision at the abscessed sites, purulent drainage expressed along with sanguineous material The 2 abscesses though adjacent, did not appear to intercede Wound search with loculations More purulent with expression with the inferior abscess Wounds irrigated with nl saline Quarter packing aggressively placed in both incision  sites Dressing applied  When asked if she wanted more numbing injections she defferrd since "she's had them before".  Assessment and Plan: Meghan [name] is a 40 y.o. female who is here today for bump near genitalia  This appears to be a possible abscess. Wound culture captured. I'm starting her on doxycycline twice a day for 10 days. Advised warm compresses. She will return in 2 days for wound care. Alarming symptoms to warrant a more immediate return were discussed. We will refill birth control at this time. Flu vaccine need - Plan: Flu Vaccine QUAD 36+ mos IM  Abscess and cellulitis - Plan: HYDROcodone-acetaminophen (NORCO) 5-325 MG tablet, WOUND CULTURE  Encounter for surveillance of contraceptive pills - Plan: Norethindrone-Ethinyl Estradiol Triphasic (ARANELLE) 0.5/1/0.5-35 MG-MCG tablet  Trena Platt, PA-C [facility] 02/01/2016 3:39 PM I personally performed the services described in this documentation, which was scribed in my presence. The recorded information has been reviewed and is accurate.

## 2016-02-01 NOTE — Patient Instructions (Addendum)
Return to the clinic on 02/02/2016 Incision and Drainage, Care After Refer to this sheet in the next few weeks. These instructions provide you with information on caring for yourself after your procedure. Your caregiver may also give you more specific instructions. Your treatment has been planned according to current medical practices, but problems sometimes occur. Call your caregiver if you have any problems or questions after your procedure. HOME CARE INSTRUCTIONS   If antibiotic medicine is given, take it as directed. Finish it even if you start to feel better.  Only take over-the-counter or prescription medicines for pain, discomfort, or fever as directed by your caregiver.  Keep all follow-up appointments as directed by your caregiver.  Change any bandages (dressings) as directed by your caregiver. Replace old dressings with clean dressings.  Wash your hands before and after caring for your wound. You will receive specific instructions for cleansing and caring for your wound.  SEEK MEDICAL CARE IF:   You have increased pain, swelling, or redness around the wound.  You have increased drainage, smell, or bleeding from the wound.  You have muscle aches, chills, or you feel generally sick.  You have a fever. MAKE SURE YOU:   Understand these instructions.  Will watch your condition.  Will get help right away if you are not doing well or get worse.   This information is not intended to replace advice given to you by your health care provider. Make sure you discuss any questions you have with your health care provider.   Document Released: 07/14/2011 Document Revised: 05/12/2014 Document Reviewed: 07/14/2011 Elsevier Interactive Patient Education [employer]2016 Elsevier Inc.    IF you received an x-ray today, you will receive an invoice from [facility]. Please contact [facility] at [phone] with questions or concerns regarding your invoice.   IF you received  labwork today, you will receive an invoice from [facility]. Please contact [facility] at [phone] with questions or concerns regarding your invoice.   Our billing staff will not be able to assist you with questions regarding bills from these companies.  You will be contacted with the lab results as soon as they are available. The fastest way to get your results is to activate your My Chart account. Instructions are located on the last page of this paperwork. If you have not heard from us regarding the results in 2 weeks, please contact this office.    Influenza (Flu) Vaccine (Inactivated or Recombinant):  1. Why get vaccinated? Influenza ("flu") is a contagious disease that spreads around the [place] every year, usually between October and May. Flu is caused by influenza viruses, and is spread mainly by coughing, sneezing, and close contact. Anyone can get flu. Flu strikes suddenly and can last several days. Symptoms vary by age, but can include:  fever/chills  sore throat  muscle aches  fatigue  cough  headache  runny or stuffy nose Flu can also lead to pneumonia and blood infections, and cause diarrhea and seizures in children. If you have a medical condition, such as heart or lung disease, flu can make it worse. Flu is more dangerous for some people. Infants and young children, people 965 years of age and older, pregnant women, and people with certain health conditions or a weakened immune system are at greatest risk. Each year thousands of people in the [place] die from flu, and many more are hospitalized. Flu vaccine can:  keep you from getting flu,  make flu less severe  if you do get it, and  keep you from spreading flu to your family and other people. 2. Inactivated and recombinant flu vaccines A dose of flu vaccine is recommended every flu season. Children 6 months through 9 years of age may need two doses during the same flu  season. Everyone else needs only one dose each flu season. Some inactivated flu vaccines contain a very small amount of a mercury-based preservative called thimerosal. Studies have not shown thimerosal in vaccines to be harmful, but flu vaccines that do not contain thimerosal are available. There is no live flu virus in flu shots. They cannot cause the flu. There are many flu viruses, and they are always changing. Each year a new flu vaccine is made to protect against three or four viruses that are likely to cause disease in the upcoming flu season. But even when the vaccine doesn't exactly match these viruses, it may still provide some protection. Flu vaccine cannot prevent:  flu that is caused by a virus not covered by the vaccine, or  illnesses that look like flu but are not. It takes about 2 weeks for protection to develop after vaccination, and protection lasts through the flu season. 3. Some people should not get this vaccine Tell the person who is giving you the vaccine:  If you have any severe, life-threatening allergies. If you ever had a life-threatening allergic reaction after a dose of flu vaccine, or have a severe allergy to any part of this vaccine, you may be advised not to get vaccinated. Most, but not all, types of flu vaccine contain a small amount of egg protein.  If you ever had Guillain-Barre Syndrome (also called GBS). Some people with a history of GBS should not get this vaccine. This should be discussed with your doctor.  If you are not feeling well. It is usually okay to get flu vaccine when you have a mild illness, but you might be asked to come back when you feel better. 4. Risks of a vaccine reaction With any medicine, including vaccines, there is a chance of reactions. These are usually mild and go away on their own, but serious reactions are also possible. Most people who get a flu shot do not have any problems with it. Minor problems following a flu shot  include:  soreness, redness, or swelling where the shot was given  hoarseness  sore, red or itchy eyes  cough  fever  aches  headache  itching  fatigue If these problems occur, they usually begin soon after the shot and last 1 or 2 days. More serious problems following a flu shot can include the following:  There may be a small increased risk of Guillain-Barre Syndrome (GBS) after inactivated flu vaccine. This risk has been estimated at 1 or 2 additional cases per million people vaccinated. This is much lower than the risk of severe complications from flu, which can be prevented by flu vaccine.  Young children who get the flu shot along with pneumococcal vaccine (PCV13) and/or DTaP vaccine at the same time might be slightly more likely to have a seizure caused by fever. Ask your doctor for more information. Tell your doctor if a child who is getting flu vaccine has ever had a seizure. Problems that could happen after any injected vaccine:  People sometimes faint after a medical procedure, including vaccination. Sitting or lying down for about 15 minutes can help prevent fainting, and injuries caused by a fall. Tell your doctor if you  feel dizzy, or have vision changes or ringing in the ears.  Some people get severe pain in the shoulder and have difficulty moving the arm where a shot was given. This happens very rarely.  Any medication can cause a severe allergic reaction. Such reactions from a vaccine are very rare, estimated at about 1 in a million doses, and would happen within a few minutes to a few hours after the vaccination. As with any medicine, there is a very remote chance of a vaccine causing a serious injury or death. The safety of vaccines is always being monitored. For more information, visit: http://floyd.org/ 5. What if there is a serious reaction? What should I look for?  Look for anything that concerns you, such as signs of a severe allergic reaction,  very high fever, or unusual behavior. Signs of a severe allergic reaction can include hives, swelling of the face and throat, difficulty breathing, a fast heartbeat, dizziness, and weakness. These would start a few minutes to a few hours after the vaccination. What should I do?  If you think it is a severe allergic reaction or other emergency that can't wait, call 9-1-1 and get the person to the nearest hospital. Otherwise, call your doctor.  Reactions should be reported to the Vaccine Adverse Event Reporting System (VAERS). Your doctor should file this report, or you can do it yourself through the VAERS web site at www.vaers.LAgents.no, or by calling 1-(310)545-0971. VAERS does not give medical advice. 6. The National Vaccine Injury Compensation Program The [employer] Vaccine Injury Compensation Program (VICP) is a federal program that was created to compensate people who may have been injured by certain vaccines. Persons who believe they may have been injured by a vaccine can learn about the program and about filing a claim by calling 1-220-241-3217 or visiting the VICP website at SpiritualWord.at. There is a time limit to file a claim for compensation. 7. How can I learn more?  Ask your healthcare provider. He or she can give you the vaccine package insert or suggest other sources of information.  Call your local or state health department.  Contact the Centers for Disease Control and Prevention (CDC):  Call (480) 301-0294 (1-800-CDC-INFO) or  Visit CDC's website at BiotechRoom.com.cy Vaccine Information Statement Inactivated Influenza Vaccine (12/09/2013)   This information is not intended to replace advice given to you by your health care provider. Make sure you discuss any questions you have with your health care provider.   Document Released: 02/13/2006 Document Revised: 05/12/2014 Document Reviewed: 12/12/2013 Elsevier Interactive Patient Education [employer]

## 2016-02-04 ENCOUNTER — Ambulatory Visit (INDEPENDENT_AMBULATORY_CARE_PROVIDER_SITE_OTHER): Payer: BLUE CROSS/BLUE SHIELD | Admitting: Physician Assistant

## 2016-02-04 VITALS — BP 120/70 | HR 72 | Temp 98.6°F | Resp 16 | Ht 64.0 in | Wt 157.8 lb

## 2016-02-04 DIAGNOSIS — Z5189 Encounter for other specified aftercare: Secondary | ICD-10-CM

## 2016-02-04 LAB — WOUND CULTURE
Gram Stain: NONE SEEN
Gram Stain: NONE SEEN

## 2016-02-04 NOTE — Patient Instructions (Addendum)
Please return to clinic in 48 hours for repacking of your wound.   Change dressing if dressing is soiled. Keep covered at all times except when showering. You may get wet in the shower but do not scrub.  Apply warm compresses/heating pad to facilitate drainage. Leave packing in place. Do not apply any ointments as this may delay healing. If an antibiotic was prescribed, take as directed until finished. Return in 48 hours for wound care.

## 2016-02-04 NOTE — Progress Notes (Signed)
   Meghan [name]  MRN: [id number] DOB: 10/16/1975  PCP: Per Patient No Pcp (Inactive)  Subjective:  Pt is a 40 year old female who presents to clinic for follow-up wound. She is here today with her daughter who is interpreting. She was here three days ago and seen by [name]. At that time, Judeth CornfieldStephanie drained two abscesses on her right buttock. She has taken showers and tried to redress the wound. She says one of the strings fell out yesterday.  She is using warm compresses at home and is taking her antibiotics with no reported side effects.   Review of Systems  Constitutional: Negative for chills, diaphoresis, fatigue and fever.  Cardiovascular: Negative.   Gastrointestinal: Negative.   Musculoskeletal: Negative.   Skin: Positive for wound.    There are no active problems to display for this patient.   Current Outpatient Prescriptions on File Prior to Visit  Medication Sig Dispense Refill  . doxycycline (VIBRAMYCIN) 100 MG capsule Take 1 capsule (100 mg total) by mouth 2 (two) times daily. 20 capsule 0  . fluticasone (FLONASE) 50 MCG/ACT nasal spray Place 2 sprays into both nostrils daily. 16 g 6  . HYDROcodone-acetaminophen (NORCO) 5-325 MG tablet Take 1 tablet by mouth every 6 (six) hours as needed. 12 tablet 0  . Norethindrone-Ethinyl Estradiol Triphasic (ARANELLE) 0.5/1/0.5-35 MG-MCG tablet Take 1 tablet by mouth daily. 3 Package 3  . triamcinolone cream (KENALOG) 0.1 % Apply topically 2 (two) times daily. Apply for 2 weeks. May use on face (Patient not taking: Reported on 02/01/2016) 30 g 0   No current facility-administered medications on file prior to visit.     No Known Allergies  Objective:  BP 120/70 (BP Location: Right Arm, Patient Position: Sitting, Cuff Size: Small)   Pulse 72   Temp 98.6 F (37 C) (Oral)   Resp 16   Ht 5\' 4"  (1.626 m)   Wt 157 lb 12.8 oz (71.6 kg)   LMP  (LMP Unknown)   SpO2 100%   BMI 27.09 kg/m   Physical Exam  Constitutional:  She is oriented to person, place, and time and well-developed, well-nourished, and in no distress. No distress.  Cardiovascular: Normal rate, regular rhythm and normal heart sounds.   Pulmonary/Chest: Effort normal. No respiratory distress.  Neurological: She is alert and oriented to person, place, and time. GCS score is 15.  Skin: Skin is warm and dry.     Psychiatric: Mood, memory, affect and judgment normal.  Vitals reviewed.   Procedure:  Verbal consent obtained. Purulent drainage expressed from most superior site. Packing removed from inferior incision, no drainage noted. Both sites irrigated with 5-cc 2% lidocaine and explored with hemostats. The two adjacent abscesses do not intercede, which correlates with Ronnell FreshwaterStephanie English's exam. Wounds lightly packed with 1/4 inch packing and dressed.  Assessment and Plan :  1. Wound check, abscess - Wound care discussed with patient. She will continue her current course of Doxycycline, use of warm compresses and will RTC in 48 hours for wound recheck. She understands and agrees.   Marco CollieWhitney Jedrek Dinovo, PA-C  [facility] 02/04/2016 4:11 PM

## 2016-02-06 ENCOUNTER — Ambulatory Visit (INDEPENDENT_AMBULATORY_CARE_PROVIDER_SITE_OTHER): Payer: BLUE CROSS/BLUE SHIELD | Admitting: Physician Assistant

## 2016-02-06 DIAGNOSIS — Z5189 Encounter for other specified aftercare: Secondary | ICD-10-CM

## 2016-02-06 NOTE — Progress Notes (Signed)
   Meghan [name] H Dettinger  MRN: [id number] DOB: 07/01/1975  PCP: Per Patient No Pcp (Inactive)  Subjective:  Pt is a 40 year old female who presents to clinic for follow-up wound check. She is here today with her daughter who is interpreting for her. She was here five days ago and seen by [name] who performed an I&D on two abscesses next to her vagina. She says she is feeling better that she was before the draining. Notes one of the strings fell out.  She is using warm compresses and taking her antibiotics as prescribed with no side effects.   Review of Systems  Constitutional: Negative for chills, fatigue and fever.  Respiratory: Negative.   Cardiovascular: Negative.   Gastrointestinal: Negative.   Genitourinary: Negative.   Skin: Positive for wound.    There are no active problems to display for this patient.   Current Outpatient Prescriptions on File Prior to Visit  Medication Sig Dispense Refill  . doxycycline (VIBRAMYCIN) 100 MG capsule Take 1 capsule (100 mg total) by mouth 2 (two) times daily. 20 capsule 0  . fluticasone (FLONASE) 50 MCG/ACT nasal spray Place 2 sprays into both nostrils daily. 16 g 6  . HYDROcodone-acetaminophen (NORCO) 5-325 MG tablet Take 1 tablet by mouth every 6 (six) hours as needed. 12 tablet 0  . Norethindrone-Ethinyl Estradiol Triphasic (ARANELLE) 0.5/1/0.5-35 MG-MCG tablet Take 1 tablet by mouth daily. 3 Package 3  . triamcinolone cream (KENALOG) 0.1 % Apply topically 2 (two) times daily. Apply for 2 weeks. May use on face (Patient not taking: Reported on 02/01/2016) 30 g 0   No current facility-administered medications on file prior to visit.     No Known Allergies  Objective:  LMP  (LMP Unknown)   Physical Exam  Constitutional: She is oriented to person, place, and time and well-developed, well-nourished, and in no distress. No distress.  Cardiovascular: Normal rate, regular rhythm and normal heart sounds.   Neurological: She is alert and  oriented to person, place, and time. GCS score is 15.  Skin: Skin is warm and dry.     Psychiatric: Mood, memory, affect and judgment normal.  Vitals reviewed.  Procedure: Verbal consent obtained. Wound cavity explored with loculations. Inferior site healing well, nearly closed. Superior site explored and noted to have 1 cm cavitation present. No drainage expressed from either site.  1/4 inch packing used to lightly pack superior site. Wound dressed and wound care discussed.  Assessment and Plan :  1. Encounter for wound care - Wound care discussed with patient. She will RTC in 48 hours for wound check.   Marco CollieWhitney Emberley Kral, PA-C  [facility] 02/06/2016 2:13 PM

## 2016-02-08 ENCOUNTER — Ambulatory Visit (INDEPENDENT_AMBULATORY_CARE_PROVIDER_SITE_OTHER): Payer: BLUE CROSS/BLUE SHIELD | Admitting: Family Medicine

## 2016-02-08 VITALS — BP 116/72 | HR 76 | Temp 98.3°F | Resp 16 | Ht 64.0 in | Wt 160.2 lb

## 2016-02-08 DIAGNOSIS — Z5189 Encounter for other specified aftercare: Secondary | ICD-10-CM | POA: Diagnosis not present

## 2016-02-08 NOTE — Patient Instructions (Signed)
     IF you received an x-ray today, you will receive an invoice from [facility]. Please contact [facility] at [phone] with questions or concerns regarding your invoice.   IF you received labwork today, you will receive an invoice from [facility]. Please contact [facility] at [phone] with questions or concerns regarding your invoice.   Our billing staff will not be able to assist you with questions regarding bills from these companies.  You will be contacted with the lab results as soon as they are available. The fastest way to get your results is to activate your My Chart account. Instructions are located on the last page of this paperwork. If you have not heard from us regarding the results in 2 weeks, please contact this office.      

## 2016-02-08 NOTE — Progress Notes (Signed)
Encounter for Wound Recheck   40 year old female presents for wound check post I&D of abscess 02/01/16. She has returned to the clinic for wound check 02/04/2016 and 02/06/2016 in which wound appeared to be healing without complication. Today she presents with a less than 5 mm wound without measurable depth. Wound is absent of exudate, wound bed moist with red center.  Negative for warmth, induration, or tenderness with palpation.   Plan:  Cleaned and redressed with nonadherent dress.   Advise no additional follow-up necessary unless problems occurs.  Godfrey PickKimberly S. Tiburcio PeaHarris, MSN, FNP-C [facility]

## 2016-04-16 ENCOUNTER — Other Ambulatory Visit: Payer: Self-pay | Admitting: Emergency Medicine

## 2016-04-16 DIAGNOSIS — J302 Other seasonal allergic rhinitis: Secondary | ICD-10-CM

## 2016-04-16 MED ORDER — FLUTICASONE PROPIONATE 50 MCG/ACT NA SUSP: 2.0000 | Freq: Every day | NASAL | 6 refills | Status: DC

## 2016-04-30 ENCOUNTER — Other Ambulatory Visit: Payer: Self-pay | Admitting: Family Medicine

## 2016-04-30 DIAGNOSIS — J302 Other seasonal allergic rhinitis: Secondary | ICD-10-CM

## 2016-05-01 ENCOUNTER — Ambulatory Visit (INDEPENDENT_AMBULATORY_CARE_PROVIDER_SITE_OTHER): Payer: BLUE CROSS/BLUE SHIELD | Admitting: Physician Assistant

## 2016-05-01 VITALS — BP 118/76 | HR 83 | Temp 98.5°F | Ht 64.5 in | Wt 165.2 lb

## 2016-05-01 DIAGNOSIS — R5383 Other fatigue: Secondary | ICD-10-CM

## 2016-05-01 DIAGNOSIS — Z01419 Encounter for gynecological examination (general) (routine) without abnormal findings: Secondary | ICD-10-CM

## 2016-05-01 DIAGNOSIS — Z3041 Encounter for surveillance of contraceptive pills: Secondary | ICD-10-CM | POA: Diagnosis not present

## 2016-05-01 DIAGNOSIS — Z13220 Encounter for screening for lipoid disorders: Secondary | ICD-10-CM

## 2016-05-01 DIAGNOSIS — Z124 Encounter for screening for malignant neoplasm of cervix: Secondary | ICD-10-CM | POA: Diagnosis not present

## 2016-05-01 DIAGNOSIS — Z131 Encounter for screening for diabetes mellitus: Secondary | ICD-10-CM | POA: Diagnosis not present

## 2016-05-01 DIAGNOSIS — L309 Dermatitis, unspecified: Secondary | ICD-10-CM

## 2016-05-01 DIAGNOSIS — Z1329 Encounter for screening for other suspected endocrine disorder: Secondary | ICD-10-CM

## 2016-05-01 DIAGNOSIS — Z130 Encounter for screening for diseases of the blood and blood-forming organs and certain disorders involving the immune mechanism: Secondary | ICD-10-CM | POA: Diagnosis not present

## 2016-05-01 DIAGNOSIS — Z0000 Encounter for general adult medical examination without abnormal findings: Secondary | ICD-10-CM

## 2016-05-01 MED ORDER — DESONIDE 0.05 % EX CREA: TOPICAL_CREAM | Freq: Two times a day (BID) | CUTANEOUS | 0 refills | Status: DC

## 2016-05-01 MED ORDER — NORETHIN-ETH ESTRAD TRIPHASIC 0.5/1/0.5-35 MG-MCG PO TABS: 1.0000 | ORAL_TABLET | Freq: Every day | ORAL | 4 refills | Status: DC

## 2016-05-01 NOTE — Progress Notes (Signed)
[facility] [address], [place] [id number] [phone]  Date:  05/01/2016   Name:  Meghan [name]   DOB:  04/26/1976   MRN:  [id number]  PCP:  Per Patient No Pcp (Inactive)    Chief Complaint: Annual Exam   History of Present Illness:  This is a 40 y.o. female with PMH seasonal allergies, eczema who is presenting for CPE. Video interpreter used today [id number].   H/o seasonal allergies - controlled with flonase and OTC Flonase and allergy pills.   Complaints: Eczema - She has had eczema for several years. Needs medication. C/o itching and dry skin on her fingers.  Feeling tired - She feels tired through the day. "Feels exhausted with the little things I am doing" like house chores. She is unsure about how long this has been going on, however this is new since her last physical exam.   LMP: Dec. 6.  Contraception: Aran-Elle Last pap: 2013 Sexual history: Married x 18 years  Immunizations: UTD flu shot  Dentist: Cleaning every 6 months Eye: R 20/40, L 20/30, both 20/25. Does not wear glasses.  Diet/Exercise: Does not exercise, eats fruits, vegetables, occasional fast food.  Fam hx: Healthy  Tobacco/alcohol/substance use: None   Review of Systems:  Review of Systems  Constitutional: Positive for fatigue. Negative for appetite change, chills, diaphoresis and fever.  HENT: Negative for congestion, postnasal drip, rhinorrhea, sinus pressure, sneezing and sore throat.   Respiratory: Negative for cough, chest tightness, shortness of breath and wheezing.   Cardiovascular: Negative for chest pain and palpitations.  Gastrointestinal: Negative for abdominal pain, constipation, diarrhea, nausea and vomiting.  Skin: Positive for rash.  Neurological: Negative for weakness, light-headedness and headaches.  Psychiatric/Behavioral: Negative for sleep disturbance. The patient is not nervous/anxious.     There are no active problems to display for this  patient.   Prior to Admission medications   Medication Sig Start Date End Date Taking? Authorizing Provider  fluticasone (FLONASE) 50 MCG/ACT nasal spray Place 2 sprays into both nostrils daily. 04/16/16  Yes Pearline CablesJessica C Copland, MD  Norethindrone-Ethinyl Estradiol Triphasic (ARANELLE) 0.5/1/0.5-35 MG-MCG tablet Take 1 tablet by mouth daily. 02/01/16  Yes Collie SiadStephanie D English, PA  HYDROcodone-acetaminophen (NORCO) 5-325 MG tablet Take 1 tablet by mouth every 6 (six) hours as needed. Patient not taking: Reported on 05/01/2016 02/01/16   Collie SiadStephanie D English, PA  triamcinolone cream (KENALOG) 0.1 % Apply topically 2 (two) times daily. Apply for 2 weeks. May use on face Patient not taking: Reported on 05/01/2016 04/22/12   Hayden Rasmussenavid Mabe, NP    No Known Allergies  No past surgical history on file.  Social History  Substance Use Topics  . Smoking status: Never Smoker  . Smokeless tobacco: Never Used  . Alcohol use No    No family history on file.  Medication list has been reviewed and updated.  Physical Examination:  Physical Exam  Constitutional: She is oriented to person, place, and time. She appears well-developed and well-nourished. No distress.  HENT:  Head: Normocephalic and atraumatic.  Mouth/Throat: Oropharynx is clear and moist.  Eyes: Conjunctivae and EOM are normal. Pupils are equal, round, and reactive to light.  Neck: Normal range of motion. Neck supple. No thyromegaly present.  Cardiovascular: Normal rate, regular rhythm and normal heart sounds.   No murmur heard. Pulmonary/Chest: Effort normal and breath sounds normal. She has no wheezes.  Abdominal: Soft. There is no tenderness.  Genitourinary: Vagina normal. Cervix exhibits friability.  Right adnexum displays no mass and no tenderness. Left adnexum displays no mass and no tenderness.  Musculoskeletal: Normal range of motion.  Neurological: She is alert and oriented to person, place, and time. She has normal reflexes.   Skin: Skin is warm and dry. Rash noted. Rash is maculopapular (Right PIP joint of 2nd digit).  Psychiatric: She has a normal mood and affect. Her behavior is normal. Judgment and thought content normal.  Vitals reviewed.   BP 118/76 (Cuff Size: Normal)   Pulse 83   Temp 98.5 F (36.9 C) (Oral)   Ht 5' 4.5" (1.638 m)   Wt 165 lb 3.2 oz (74.9 kg)   SpO2 100%   BMI 27.92 kg/m   Assessment and Plan: 1. Annual physical exam 2. Other fatigue - VITAMIN D 25 Hydroxy (Vit-D Deficiency, Fractures)  3. Encounter for gynecological examination without abnormal finding 4. Screening for cervical cancer - Pap IG and HPV (high risk) DNA detection 5. Encounter for surveillance of contraceptive pills - Norethindrone-Ethinyl Estradiol Triphasic (ARANELLE) 0.5/1/0.5-35 MG-MCG tablet; Take 1 tablet by mouth daily.  Dispense: 3 Package; Refill: 4  6. Eczema, unspecified type - desonide (DESOWEN) 0.05 % cream; Apply topically 2 (two) times daily.  Dispense: 30 g; Refill: 0  7. Screening for deficiency anemia - CBC  8. Screening for lipid disorders - Lipid panel  9. Screening for thyroid disorder - TSH  10. Screening for diabetes mellitus - Basic metabolic panel   Marco CollieWhitney Shamal Stracener, PA-C  [facility] 05/01/2016 8:41 AM

## 2016-05-01 NOTE — Patient Instructions (Addendum)
Thank you for coming in today. I hope you feel we met your needs.  Feel free to call [facility] if you have any questions or further requests.  Please consider signing up for MyChart if you do not already have it, as this is a great way to communicate with me.  Best,  Whitney Belita Warsame, PA-C  Atopic Dermatitis Atopic dermatitis is a skin disorder that causes inflammation of the skin. This is the most common type of eczema. Eczema is a group of skin conditions that cause the skin to be itchy, red, and swollen. This condition is generally worse during the cooler winter months and often improves during the warm summer months. Symptoms can vary from person to person. Atopic dermatitis usually starts showing signs in infancy and can last through adulthood. This condition cannot be passed from one person to another (non-contagious), but is more common in families. Atopic dermatitis may not always be present. When it is present, it is called a flare-up. What are the causes? The exact cause of this condition is not known. Flare-ups of the condition may be triggered by:  Contact with something you are sensitive or allergic to.  Stress.  Certain foods.  Extremely hot or cold weather.  Harsh chemicals and soaps.  Dry air.  Chlorine. What increases the risk? This condition is more likely to develop in people who have a personal history or family history of eczema, allergies, asthma, or hay fever. What are the signs or symptoms? Symptoms of this condition include:  Dry, scaly skin.  Red, itchy rash.  Itchiness, which can be severe. This may occur before the skin rash. This can make sleeping difficult.  Skin thickening and cracking can occur over time. How is this diagnosed? This condition is diagnosed based on your symptoms, a medical history, and a physical exam. How is this treated? There is no cure for this condition, but symptoms can usually be controlled. Treatment focuses on:  Controlling  the itching and scratching. You may be given medicines, such as antihistamines or steroid creams.  Limiting exposure to things that you are sensitive or allergic to (allergens).  Recognizing situations that cause stress and developing a plan to manage stress. If your atopic dermatitis does not get better with medicines or is all over your body (widespread) , a treatment using a specific type of light (phototherapy) may be used. Follow these instructions at home: Skin care  Keep your skin well-moisturized. This seals in moisture and help prevent dryness.  Use unscented lotions that have petroleum in them.  Avoid lotions that contain alcohol and water. They can dry the skin.  Keep baths or showers short (less than 5 minutes) in warm water. Do not use hot water.  Use mild, unscented cleansers for bathing. Avoid soap and bubble bath.  Apply a moisturizer to your skin right after a bath or shower.   Do not apply anything to your skin without checking with your health care provider. General instructions  Dress in clothes made of cotton or cotton blends. Dress lightly because heat increases itching.  When washing your clothes, rinse your clothes twice so all of the soap is removed.  Avoid any triggers that can cause a flare-up.  Try to manage your stress.  Keep your fingernails cut short.  Avoid scratching. Scratching makes the rash and itching worse. It may also result in a skin infection (impetigo) due to a break in the skin caused by scratching.  Take or apply over-the-counter and prescription  medicines only as told by your health care provider.  Keep all follow-up visits as told by your health care provider. This is important.  Do not be around people who have cold sores or fever blisters. If you get the infection, it may cause your atopic dermatitis to worsen. Contact a health care provider if:  Your itching interferes with sleep.  Your rash gets worse or is not better  within one week of starting treatment.  You have a fever.  You have a rash flare-up after having contact with someone who has cold sores or fever blisters. Get help right away if:  You develop pus or soft yellow scabs in the rash area. Summary  This condition causes a red rash and itchy, dry, scaly skin.  Treatment focuses on controlling the itching and scratching, limiting exposure to things that you are sensitive or allergic to (allergens), and recognizing situations that cause stress and developing a plan to manage stress.  Keep your skin well-moisturized.  Keep baths or showers less than 5 minutes. This information is not intended to replace advice given to you by your health care provider. Make sure you discuss any questions you have with your health care provider. Document Released: 04/18/2000 Document Revised: 09/27/2015 Document Reviewed: 11/22/2012 Elsevier Interactive Patient Education  2017 [employer] Maintenance, Female Introduction Adopting a healthy lifestyle and getting preventive care can go a long way to promote health and wellness. Talk with your health care provider about what schedule of regular examinations is right for you. This is a good chance for you to check in with your provider about disease prevention and staying healthy. In between checkups, there are plenty of things you can do on your own. Experts have done a lot of research about which lifestyle changes and preventive measures are most likely to keep you healthy. Ask your health care provider for more information. Weight and diet Eat a healthy diet  Be sure to include plenty of vegetables, fruits, low-fat dairy products, and lean protein.  Do not eat a lot of foods high in solid fats, added sugars, or salt.  Get regular exercise. This is one of the most important things you can do for your health.  Most adults should exercise for at least 150 minutes each week. The exercise should  increase your heart rate and make you sweat (moderate-intensity exercise).  Most adults should also do strengthening exercises at least twice a week. This is in addition to the moderate-intensity exercise. Maintain a healthy weight  Body mass index (BMI) is a measurement that can be used to identify possible weight problems. It estimates body fat based on height and weight. Your health care provider can help determine your BMI and help you achieve or maintain a healthy weight.  For females 59 years of age and older:  A BMI below 18.5 is considered underweight.  A BMI of 18.5 to 24.9 is normal.  A BMI of 25 to 29.9 is considered overweight.  A BMI of 30 and above is considered obese. Watch levels of cholesterol and blood lipids  You should start having your blood tested for lipids and cholesterol at 42 years of age, then have this test every 5 years.  You may need to have your cholesterol levels checked more often if:  Your lipid or cholesterol levels are high.  You are older than 42 years of age.  You are at high risk for heart disease. Cancer screening Lung Cancer  Lung cancer  screening is recommended for adults 31-28 years old who are at high risk for lung cancer because of a history of smoking.  A yearly low-dose CT scan of the lungs is recommended for people who:  Currently smoke.  Have quit within the past 15 years.  Have at least a 30-pack-year history of smoking. A pack year is smoking an average of one pack of cigarettes a day for 1 year.  Yearly screening should continue until it has been 15 years since you quit.  Yearly screening should stop if you develop a health problem that would prevent you from having lung cancer treatment. Breast Cancer  Practice breast self-awareness. This means understanding how your breasts normally appear and feel.  It also means doing regular breast self-exams. Let your health care provider know about any changes, no matter how  small.  If you are in your 20s or 30s, you should have a clinical breast exam (CBE) by a health care provider every 1-3 years as part of a regular health exam.  If you are 36 or older, have a CBE every year. Also consider having a breast X-ray (mammogram) every year.  If you have a family history of breast cancer, talk to your health care provider about genetic screening.  If you are at high risk for breast cancer, talk to your health care provider about having an MRI and a mammogram every year.  Breast cancer gene (BRCA) assessment is recommended for women who have family members with BRCA-related cancers. BRCA-related cancers include:  Breast.  Ovarian.  Tubal.  Peritoneal cancers.  Results of the assessment will determine the need for genetic counseling and BRCA1 and BRCA2 testing. Cervical Cancer  Your health care provider may recommend that you be screened regularly for cancer of the pelvic organs (ovaries, uterus, and vagina). This screening involves a pelvic examination, including checking for microscopic changes to the surface of your cervix (Pap test). You may be encouraged to have this screening done every 3 years, beginning at age 66.  For women ages 67-65, health care providers may recommend pelvic exams and Pap testing every 3 years, or they may recommend the Pap and pelvic exam, combined with testing for human papilloma virus (HPV), every 5 years. Some types of HPV increase your risk of cervical cancer. Testing for HPV may also be done on women of any age with unclear Pap test results.  Other health care providers may not recommend any screening for nonpregnant women who are considered low risk for pelvic cancer and who do not have symptoms. Ask your health care provider if a screening pelvic exam is right for you.  If you have had past treatment for cervical cancer or a condition that could lead to cancer, you need Pap tests and screening for cancer for at least 20 years  after your treatment. If Pap tests have been discontinued, your risk factors (such as having a new sexual partner) need to be reassessed to determine if screening should resume. Some women have medical problems that increase the chance of getting cervical cancer. In these cases, your health care provider may recommend more frequent screening and Pap tests. Colorectal Cancer  This type of cancer can be detected and often prevented.  Routine colorectal cancer screening usually begins at 42 years of age and continues through 42 years of age.  Your health care provider may recommend screening at an earlier age if you have risk factors for colon cancer.  Your health care provider may  also recommend using home test kits to check for hidden blood in the stool.  A small camera at the end of a tube can be used to examine your colon directly (sigmoidoscopy or colonoscopy). This is done to check for the earliest forms of colorectal cancer.  Routine screening usually begins at age 60.  Direct examination of the colon should be repeated every 5-10 years through 42 years of age. However, you may need to be screened more often if early forms of precancerous polyps or small growths are found. Skin Cancer  Check your skin from head to toe regularly.  Tell your health care provider about any new moles or changes in moles, especially if there is a change in a mole's shape or color.  Also tell your health care provider if you have a mole that is larger than the size of a pencil eraser.  Always use sunscreen. Apply sunscreen liberally and repeatedly throughout the day.  Protect yourself by wearing long sleeves, pants, a wide-brimmed hat, and sunglasses whenever you are outside. Heart disease, diabetes, and high blood pressure  High blood pressure causes heart disease and increases the risk of stroke. High blood pressure is more likely to develop in:  People who have blood pressure in the high end of the  normal range (130-139/85-89 mm Hg).  People who are overweight or obese.  People who are African American.  If you are 43-49 years of age, have your blood pressure checked every 3-5 years. If you are 71 years of age or older, have your blood pressure checked every year. You should have your blood pressure measured twice-once when you are at a hospital or clinic, and once when you are not at a hospital or clinic. Record the average of the two measurements. To check your blood pressure when you are not at a hospital or clinic, you can use:  An automated blood pressure machine at a pharmacy.  A home blood pressure monitor.  If you are between 15 years and 48 years old, ask your health care provider if you should take aspirin to prevent strokes.  Have regular diabetes screenings. This involves taking a blood sample to check your fasting blood sugar level.  If you are at a normal weight and have a low risk for diabetes, have this test once every three years after 42 years of age.  If you are overweight and have a high risk for diabetes, consider being tested at a younger age or more often. Preventing infection Hepatitis B  If you have a higher risk for hepatitis B, you should be screened for this virus. You are considered at high risk for hepatitis B if:  You were born in a country where hepatitis B is common. Ask your health care provider which countries are considered high risk.  Your parents were born in a high-risk country, and you have not been immunized against hepatitis B (hepatitis B vaccine).  You have HIV or AIDS.  You use needles to inject street drugs.  You live with someone who has hepatitis B.  You have had sex with someone who has hepatitis B.  You get hemodialysis treatment.  You take certain medicines for conditions, including cancer, organ transplantation, and autoimmune conditions. Hepatitis C  Blood testing is recommended for:  Everyone born from [birth year] through  1967.  Anyone with known risk factors for hepatitis C. Sexually transmitted infections (STIs)  You should be screened for sexually transmitted infections (STIs) including gonorrhea and chlamydia  if:  You are sexually active and are younger than 42 years of age.  You are older than 42 years of age and your health care provider tells you that you are at risk for this type of infection.  Your sexual activity has changed since you were last screened and you are at an increased risk for chlamydia or gonorrhea. Ask your health care provider if you are at risk.  If you do not have HIV, but are at risk, it may be recommended that you take a prescription medicine daily to prevent HIV infection. This is called pre-exposure prophylaxis (PrEP). You are considered at risk if:  You are sexually active and do not regularly use condoms or know the HIV status of your partner(s).  You take drugs by injection.  You are sexually active with a partner who has HIV. Talk with your health care provider about whether you are at high risk of being infected with HIV. If you choose to begin PrEP, you should first be tested for HIV. You should then be tested every 3 months for as long as you are taking PrEP. Pregnancy  If you are premenopausal and you may become pregnant, ask your health care provider about preconception counseling.  If you may become pregnant, take 400 to 800 micrograms (mcg) of folic acid every day.  If you want to prevent pregnancy, talk to your health care provider about birth control (contraception). Osteoporosis and menopause  Osteoporosis is a disease in which the bones lose minerals and strength with aging. This can result in serious bone fractures. Your risk for osteoporosis can be identified using a bone density scan.  If you are 44 years of age or older, or if you are at risk for osteoporosis and fractures, ask your health care provider if you should be screened.  Ask your health  care provider whether you should take a calcium or vitamin D supplement to lower your risk for osteoporosis.  Menopause may have certain physical symptoms and risks.  Hormone replacement therapy may reduce some of these symptoms and risks. Talk to your health care provider about whether hormone replacement therapy is right for you. Follow these instructions at home:  Schedule regular health, dental, and eye exams.  Stay current with your immunizations.  Do not use any tobacco products including cigarettes, chewing tobacco, or electronic cigarettes.  If you are pregnant, do not drink alcohol.  If you are breastfeeding, limit how much and how often you drink alcohol.  Limit alcohol intake to no more than 1 drink per day for nonpregnant women. One drink equals 12 ounces of beer, 5 ounces of wine, or 1 ounces of hard liquor.  Do not use street drugs.  Do not share needles.  Ask your health care provider for help if you need support or information about quitting drugs.  Tell your health care provider if you often feel depressed.  Tell your health care provider if you have ever been abused or do not feel safe at home. This information is not intended to replace advice given to you by your health care provider. Make sure you discuss any questions you have with your health care provider. Document Released: 11/04/2010 Document Revised: 09/27/2015 Document Reviewed: 01/23/2015  2017 Elsevier

## 2016-05-02 LAB — CBC
Hematocrit: 40.6 % (ref 34.0–46.6)
Hemoglobin: 13.4 g/dL (ref 11.1–15.9)
MCH: 27.9 pg (ref 26.6–33.0)
MCHC: 33 g/dL (ref 31.5–35.7)
MCV: 84 fL (ref 79–97)
Platelets: 308 10*3/uL (ref 150–379)
RBC: 4.81 x10E6/uL (ref 3.77–5.28)
RDW: 13.6 % (ref 12.3–15.4)
WBC: 4.9 10*3/uL (ref 3.4–10.8)

## 2016-05-02 LAB — BASIC METABOLIC PANEL
BUN/Creatinine Ratio: 15 (ref 9–23)
BUN: 11 mg/dL (ref 6–24)
Creatinine, Ser: 0.72 mg/dL (ref 0.57–1.00)
GFR calc Af Amer: 119 mL/min/{1.73_m2} (ref >59)

## 2016-05-02 LAB — BASIC METABOLIC PANEL WITH GFR
CO2: 24 mmol/L (ref 18–29)
Calcium: 8.6 mg/dL — ABNORMAL LOW (ref 8.7–10.2)
Chloride: 100 mmol/L (ref 96–106)
GFR calc non Af Amer: 104 mL/min/{1.73_m2} (ref >59)
Glucose: 90 mg/dL (ref 65–99)
Potassium: 4.9 mmol/L (ref 3.5–5.2)
Sodium: 138 mmol/L (ref 134–144)

## 2016-05-02 LAB — LIPID PANEL
Chol/HDL Ratio: 3.5 ratio (ref 0.0–4.4)
Cholesterol, Total: 169 mg/dL (ref 100–199)
HDL: 48 mg/dL (ref >39)
LDL Calculated: 99 mg/dL (ref 0–99)
Triglycerides: 112 mg/dL (ref 0–149)
VLDL Cholesterol Cal: 22 mg/dL (ref 5–40)

## 2016-05-02 LAB — TSH: TSH: 0.971 u[IU]/mL (ref 0.450–4.500)

## 2016-05-02 LAB — VITAMIN D 25 HYDROXY (VIT D DEFICIENCY, FRACTURES): Vit D, 25-Hydroxy: 15.5 ng/mL — ABNORMAL LOW (ref 30.0–100.0)

## 2016-05-03 LAB — PAP IG AND HPV HIGH-RISK
HPV, high-risk: NEGATIVE
PAP Smear Comment: 0

## 2016-05-11 ENCOUNTER — Encounter: Payer: Self-pay | Admitting: Physician Assistant

## 2016-05-11 NOTE — Progress Notes (Signed)
Vitamin D Deficiency - Will start initial supplementation with 800 to 1000 international units qd. Repeat labs with LAB ONLY visit in 3 months. If goal level is not achieved, higher doses may be necessary.   PAP and HPV is negative - recommend next PAP in 5 years.

## 2016-05-11 NOTE — Progress Notes (Signed)
Vitamin D Deficiency - Please have her start Vitamin D3 supplement with 800 to 1000 international units daily. Repeat labs with LAB ONLY visit in 3 months. If goal level is not achieved, higher doses may be necessary.   PAP and HPV is negative - recommend next PAP in 5 years.  All other lab work is normal.  I sent her a letter with all lab results.

## 2017-03-12 IMAGING — MG MM SCREEN MAMMOGRAM BILATERAL
4 series · 4 of 4 positions shown · non-contrast
Comparison: None.

CLINICAL DATA: Screening.

EXAM:
DIGITAL SCREENING BILATERAL MAMMOGRAM WITH CAD

[R CC]
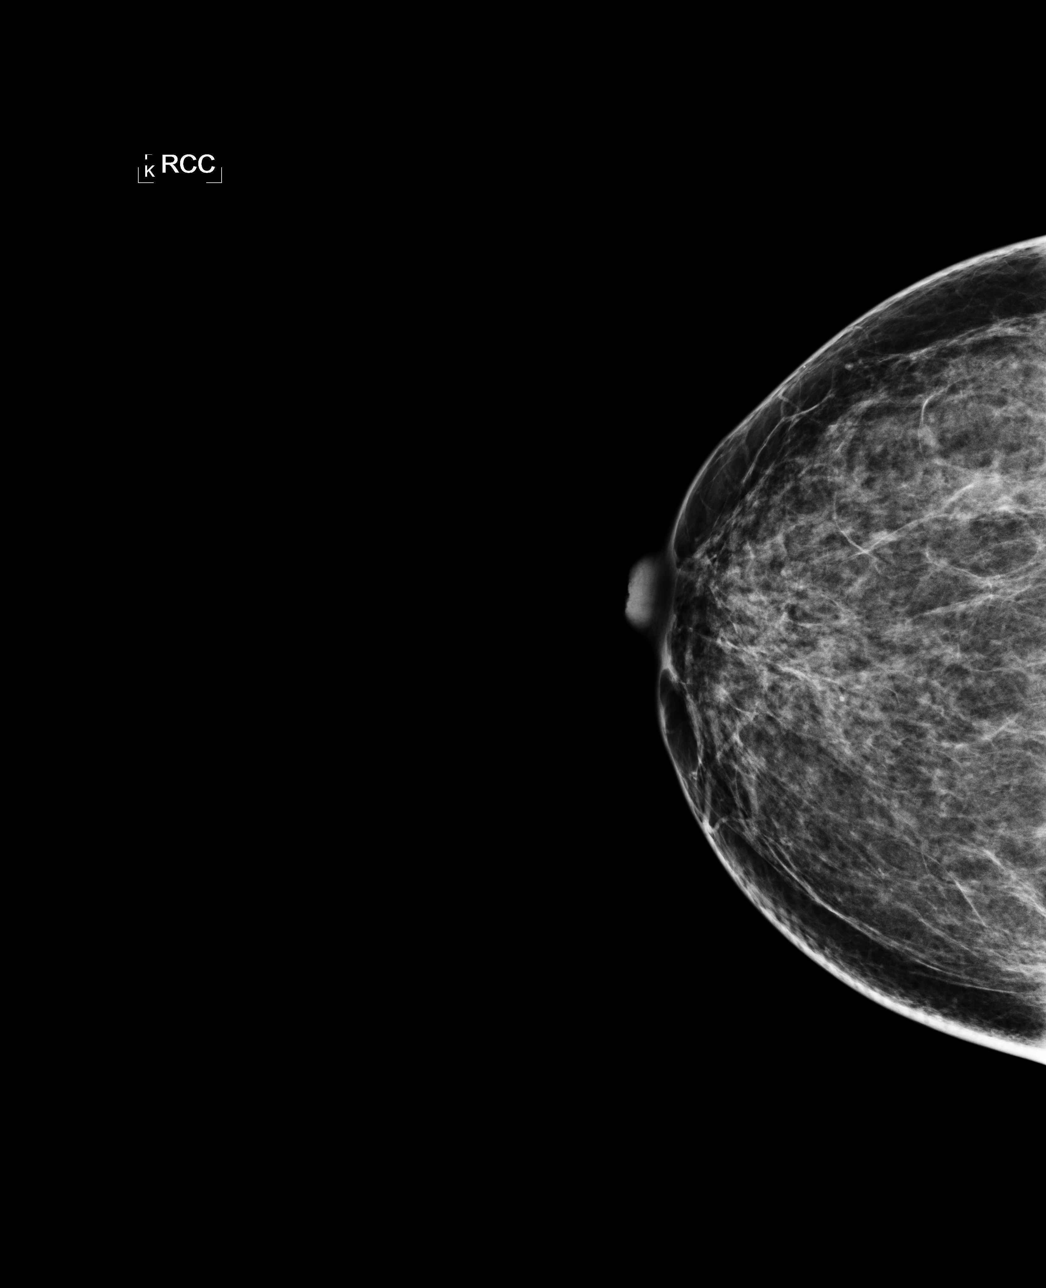

[L CC]
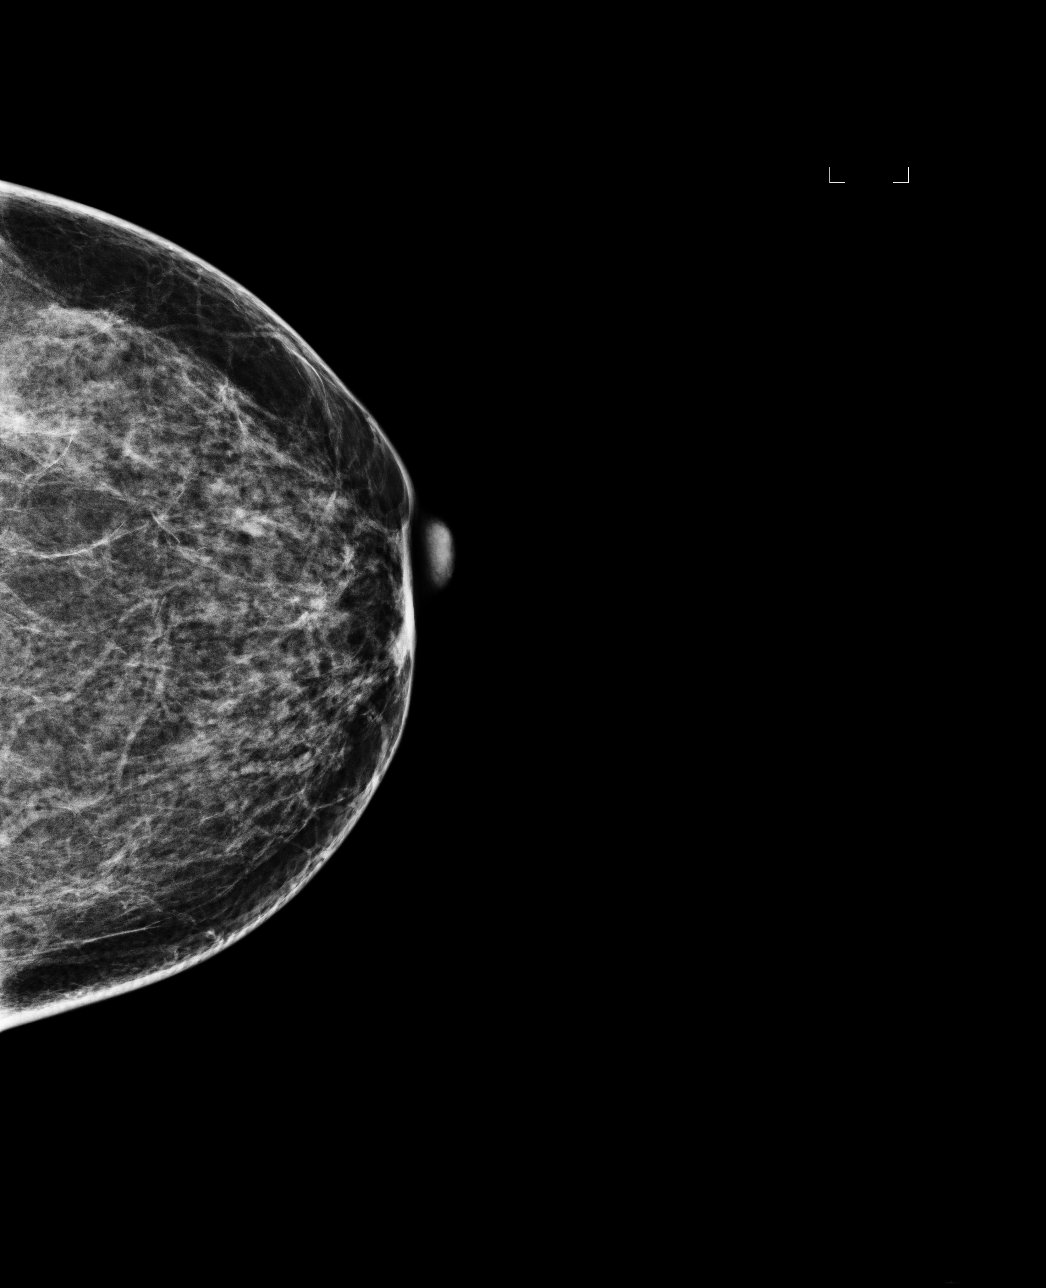

[L MLO]
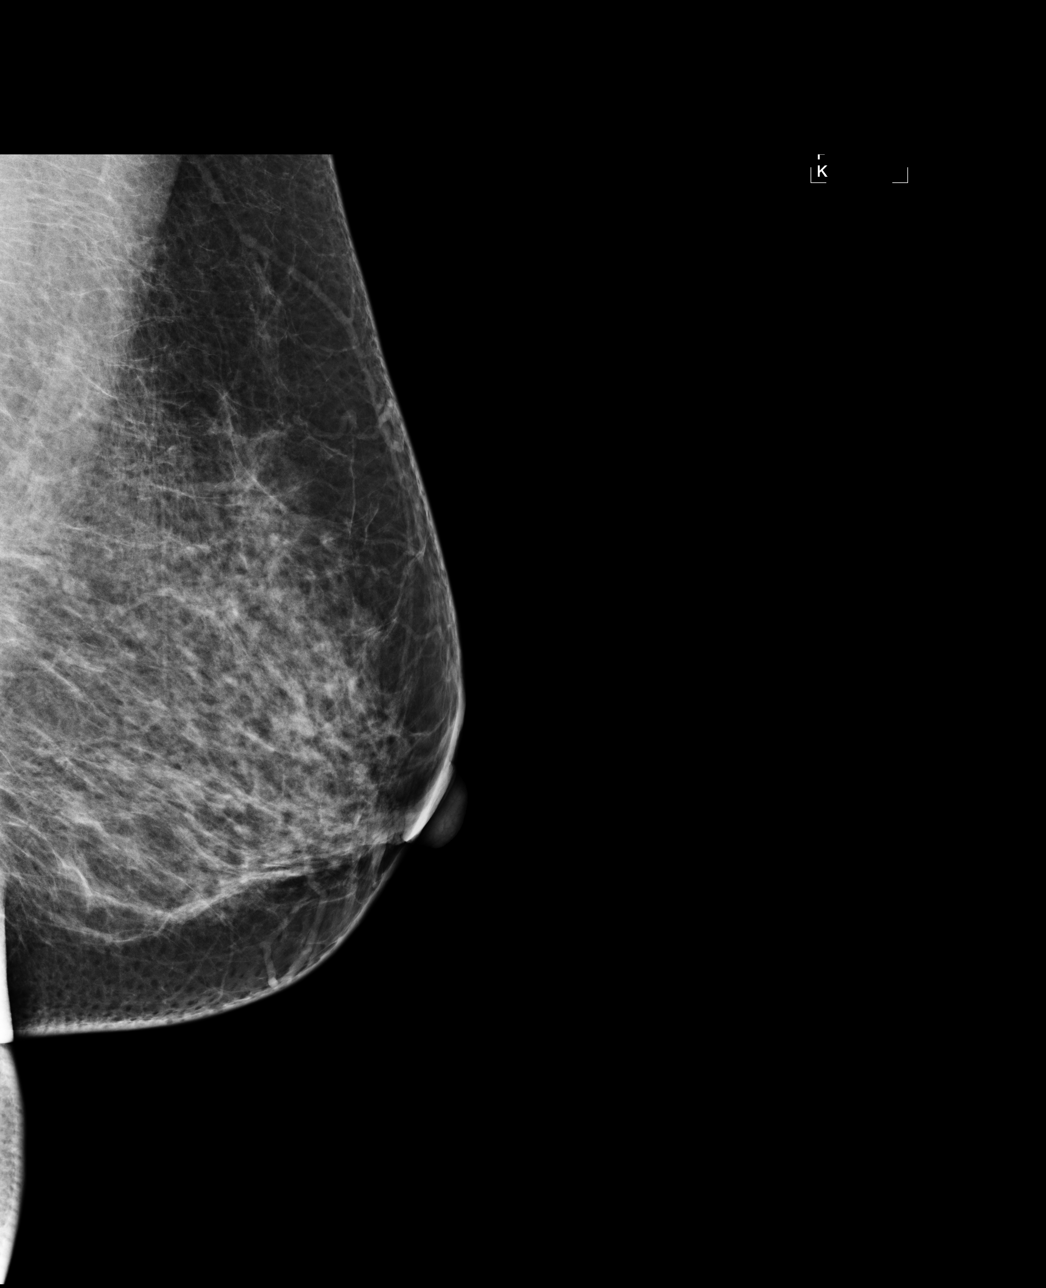

[R MLO]
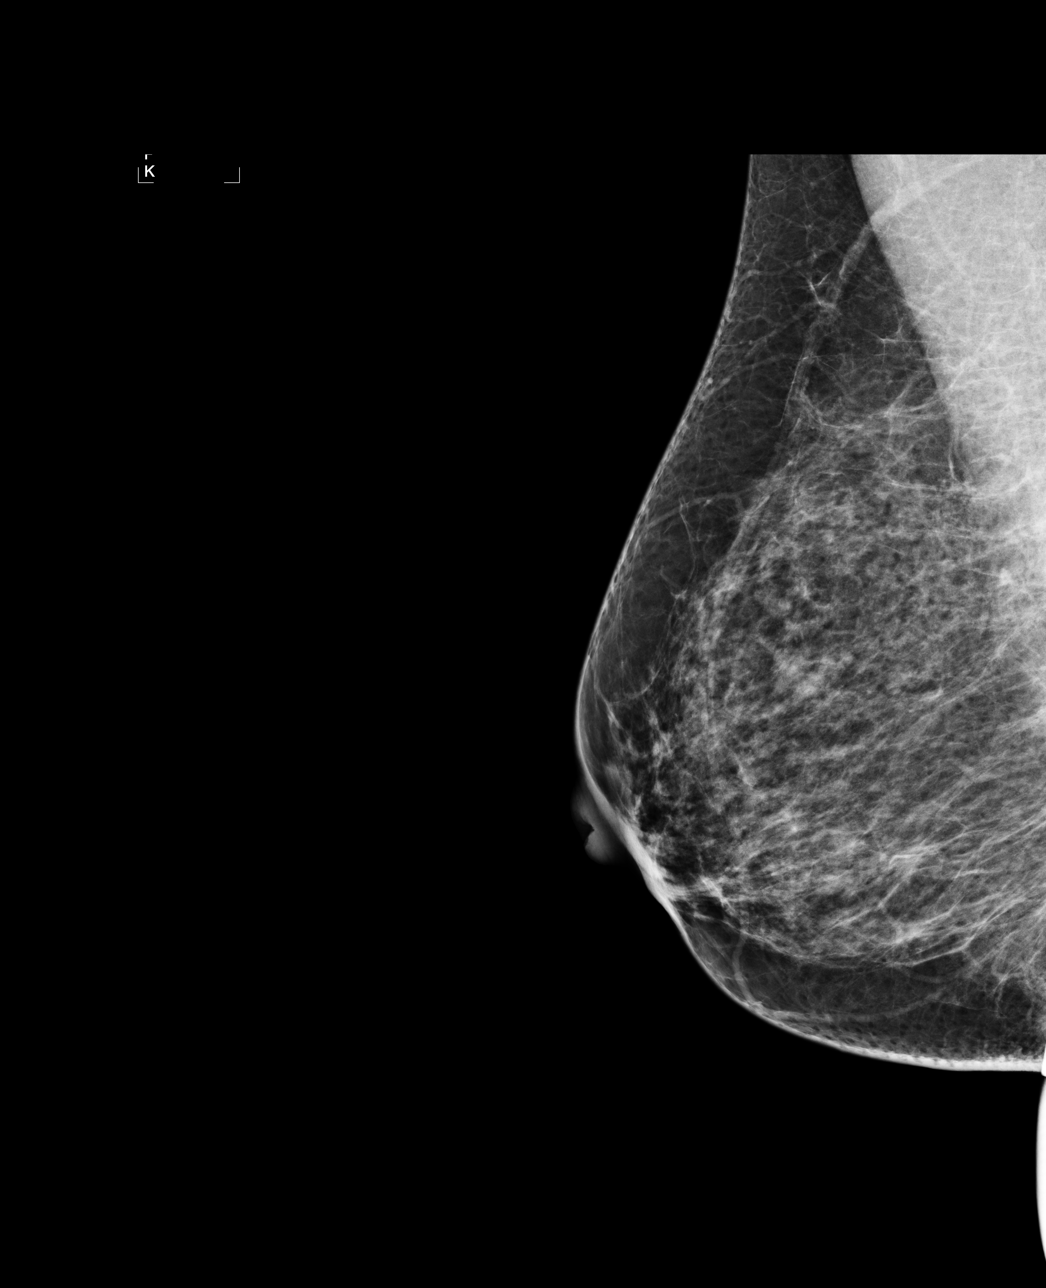

[4 of 4 positions shown; findings below may reference images not displayed]

ACR Breast Density Category b: There are scattered areas of
fibroglandular density.
FINDINGS: There are no findings suspicious for malignancy. Images were
processed with CAD.
IMPRESSION: No mammographic evidence of malignancy. A result letter of this
screening mammogram will be mailed directly to the patient.

RECOMMENDATION:
Screening mammogram in one year. (Code:[id number])

BI-RADS CATEGORY  1: Negative.

## 2017-05-24 ENCOUNTER — Other Ambulatory Visit: Payer: Self-pay | Admitting: Physician Assistant

## 2017-05-24 DIAGNOSIS — Z3041 Encounter for surveillance of contraceptive pills: Secondary | ICD-10-CM

## 2017-08-17 ENCOUNTER — Encounter: Payer: Self-pay | Admitting: Physician Assistant

## 2017-08-17 ENCOUNTER — Other Ambulatory Visit: Payer: Self-pay

## 2017-08-17 ENCOUNTER — Ambulatory Visit (INDEPENDENT_AMBULATORY_CARE_PROVIDER_SITE_OTHER): Payer: BLUE CROSS/BLUE SHIELD | Admitting: Physician Assistant

## 2017-08-17 VITALS — BP 110/70 | HR 69 | Temp 98.3°F | Resp 16 | Ht 64.0 in | Wt 174.2 lb

## 2017-08-17 DIAGNOSIS — E559 Vitamin D deficiency, unspecified: Secondary | ICD-10-CM | POA: Diagnosis not present

## 2017-08-17 DIAGNOSIS — Z13228 Encounter for screening for other metabolic disorders: Secondary | ICD-10-CM | POA: Diagnosis not present

## 2017-08-17 DIAGNOSIS — J302 Other seasonal allergic rhinitis: Secondary | ICD-10-CM

## 2017-08-17 DIAGNOSIS — Z1321 Encounter for screening for nutritional disorder: Secondary | ICD-10-CM | POA: Diagnosis not present

## 2017-08-17 DIAGNOSIS — Z3041 Encounter for surveillance of contraceptive pills: Secondary | ICD-10-CM

## 2017-08-17 DIAGNOSIS — Z13220 Encounter for screening for lipoid disorders: Secondary | ICD-10-CM

## 2017-08-17 DIAGNOSIS — Z1329 Encounter for screening for other suspected endocrine disorder: Secondary | ICD-10-CM

## 2017-08-17 DIAGNOSIS — L309 Dermatitis, unspecified: Secondary | ICD-10-CM | POA: Diagnosis not present

## 2017-08-17 DIAGNOSIS — Z130 Encounter for screening for diseases of the blood and blood-forming organs and certain disorders involving the immune mechanism: Secondary | ICD-10-CM

## 2017-08-17 DIAGNOSIS — Z0000 Encounter for general adult medical examination without abnormal findings: Secondary | ICD-10-CM

## 2017-08-17 LAB — POCT URINALYSIS DIP (MANUAL ENTRY)
Bilirubin, UA: NEGATIVE
Glucose, UA: NEGATIVE mg/dL
Ketones, POC UA: NEGATIVE mg/dL
Leukocytes, UA: NEGATIVE
Nitrite, UA: NEGATIVE
Protein Ur, POC: NEGATIVE mg/dL
Spec Grav, UA: 1.015 (ref 1.010–1.025)
Urobilinogen, UA: 0.2 U/dL
pH, UA: 5.5 (ref 5.0–8.0)

## 2017-08-17 MED ORDER — TRIAMCINOLONE ACETONIDE 0.1 % EX CREA: 1.0000 "application " | TOPICAL_CREAM | Freq: Two times a day (BID) | CUTANEOUS | 0 refills | Status: AC

## 2017-08-17 MED ORDER — FLUTICASONE PROPIONATE 50 MCG/ACT NA SUSP: 2.0000 | Freq: Every day | NASAL | 6 refills | Status: DC

## 2017-08-17 MED ORDER — NORETHIN-ETH ESTRAD TRIPHASIC 0.5/1/0.5-35 MG-MCG PO TABS: 1.0000 | ORAL_TABLET | Freq: Every day | ORAL | 0 refills | Status: DC

## 2017-08-17 NOTE — Patient Instructions (Addendum)
For Vitamin D: Start taking a Vitamin D3 supplement with 800 - 1,000 international units (IU) daily.  Calcium-All patients should maintain a daily total calcium intake (diet plus supplement) of 1000 mg (for ages 13 to 70 years) to 1200 mg (for women ages 48 through 78 years and all adults 60 years and older). The Upper Level (UL) of intake for calcium in most adults is 2000 to 2500 mg daily.  Come back and see me in 3-4 months to recheck.  What is a vitamin D deficiency?-A vitamin D deficiency is when you do not have enough vitamin D in your body. This is a problem, because the body needs vitamin D to absorb calcium and for other important jobs. People who do not have enough vitamin D can: ?Have weak or soft bones, which can break easily or change in shape ?Have weak muscles, which makes them more likely to fall  What foods and drinks have vitamin D?-Foods and drinks that have a lot of vitamin D include: -Milk, orange juice, or yogurt with vitamin D added -Cooked salmon or mackerel -Canned tuna fish -Cereals with vitamin D added -Cod liver oil People's bodies can also get vitamin D from the sun. The body uses sunlight that shines on the skin to make vitamin D.  It is important not to take too much vitamin D. Taking too much vitamin D can make you sick.  For Eczema: Warm soaking baths or showers using mild or soap-free cleansers should be part of the routine skin care.  Bathed and soak for 10 minutes in warm water once today. Pat dry.  Immediately apply the below creams: To healthy skin apply Aquaphor or Vaseline jelly twice a day. To affected areas on the face and neck, apply: Kenalog 0.1%   Health Maintenance, Female Adopting a healthy lifestyle and getting preventive care can go a long way to promote health and wellness. Talk with your health care provider about what schedule of regular examinations is right for you. This is a good chance for you to check in with your provider  about disease prevention and staying healthy. In between checkups, there are plenty of things you can do on your own. Experts have done a lot of research about which lifestyle changes and preventive measures are most likely to keep you healthy. Ask your health care provider for more information. Weight and diet Eat a healthy diet  Be sure to include plenty of vegetables, fruits, low-fat dairy products, and lean protein.  Do not eat a lot of foods high in solid fats, added sugars, or salt.  Get regular exercise. This is one of the most important things you can do for your health. ? Most adults should exercise for at least 150 minutes each week. The exercise should increase your heart rate and make you sweat (moderate-intensity exercise). ? Most adults should also do strengthening exercises at least twice a week. This is in addition to the moderate-intensity exercise.  Maintain a healthy weight  Body mass index (BMI) is a measurement that can be used to identify possible weight problems. It estimates body fat based on height and weight. Your health care provider can help determine your BMI and help you achieve or maintain a healthy weight.  For females 70 years of age and older: ? A BMI below 18.5 is considered underweight. ? A BMI of 18.5 to 24.9 is normal. ? A BMI of 25 to 29.9 is considered overweight. ? A BMI of 30 and above  is considered obese.  Watch levels of cholesterol and blood lipids  You should start having your blood tested for lipids and cholesterol at 44 years of age, then have this test every 5 years.  You may need to have your cholesterol levels checked more often if: ? Your lipid or cholesterol levels are high. ? You are older than 44 years of age. ? You are at high risk for heart disease.  Cancer screening Lung Cancer  Lung cancer screening is recommended for adults 60-4 years old who are at high risk for lung cancer because of a history of smoking.  A yearly  low-dose CT scan of the lungs is recommended for people who: ? Currently smoke. ? Have quit within the past 15 years. ? Have at least a 30-pack-year history of smoking. A pack year is smoking an average of one pack of cigarettes a day for 1 year.  Yearly screening should continue until it has been 15 years since you quit.  Yearly screening should stop if you develop a health problem that would prevent you from having lung cancer treatment.  Breast Cancer  Practice breast self-awareness. This means understanding how your breasts normally appear and feel.  It also means doing regular breast self-exams. Let your health care provider know about any changes, no matter how small.  If you are in your 20s or 30s, you should have a clinical breast exam (CBE) by a health care provider every 1-3 years as part of a regular health exam.  If you are 24 or older, have a CBE every year. Also consider having a breast X-ray (mammogram) every year.  If you have a family history of breast cancer, talk to your health care provider about genetic screening.  If you are at high risk for breast cancer, talk to your health care provider about having an MRI and a mammogram every year.  Breast cancer gene (BRCA) assessment is recommended for women who have family members with BRCA-related cancers. BRCA-related cancers include: ? Breast. ? Ovarian. ? Tubal. ? Peritoneal cancers.  Results of the assessment will determine the need for genetic counseling and BRCA1 and BRCA2 testing.  Cervical Cancer Your health care provider may recommend that you be screened regularly for cancer of the pelvic organs (ovaries, uterus, and vagina). This screening involves a pelvic examination, including checking for microscopic changes to the surface of your cervix (Pap test). You may be encouraged to have this screening done every 3 years, beginning at age 9.  For women ages 54-65, health care providers may recommend pelvic exams  and Pap testing every 3 years, or they may recommend the Pap and pelvic exam, combined with testing for human papilloma virus (HPV), every 5 years. Some types of HPV increase your risk of cervical cancer. Testing for HPV may also be done on women of any age with unclear Pap test results.  Other health care providers may not recommend any screening for nonpregnant women who are considered low risk for pelvic cancer and who do not have symptoms. Ask your health care provider if a screening pelvic exam is right for you.  If you have had past treatment for cervical cancer or a condition that could lead to cancer, you need Pap tests and screening for cancer for at least 20 years after your treatment. If Pap tests have been discontinued, your risk factors (such as having a new sexual partner) need to be reassessed to determine if screening should resume. Some women have  medical problems that increase the chance of getting cervical cancer. In these cases, your health care provider may recommend more frequent screening and Pap tests.  Colorectal Cancer  This type of cancer can be detected and often prevented.  Routine colorectal cancer screening usually begins at 44 years of age and continues through 44 years of age.  Your health care provider may recommend screening at an earlier age if you have risk factors for colon cancer.  Your health care provider may also recommend using home test kits to check for hidden blood in the stool.  A small camera at the end of a tube can be used to examine your colon directly (sigmoidoscopy or colonoscopy). This is done to check for the earliest forms of colorectal cancer.  Routine screening usually begins at age 66.  Direct examination of the colon should be repeated every 5-10 years through 44 years of age. However, you may need to be screened more often if early forms of precancerous polyps or small growths are found.  Skin Cancer  Check your skin from head to  toe regularly.  Tell your health care provider about any new moles or changes in moles, especially if there is a change in a mole's shape or color.  Also tell your health care provider if you have a mole that is larger than the size of a pencil eraser.  Always use sunscreen. Apply sunscreen liberally and repeatedly throughout the day.  Protect yourself by wearing long sleeves, pants, a wide-brimmed hat, and sunglasses whenever you are outside.  Heart disease, diabetes, and high blood pressure  High blood pressure causes heart disease and increases the risk of stroke. High blood pressure is more likely to develop in: ? People who have blood pressure in the high end of the normal range (130-139/85-89 mm Hg). ? People who are overweight or obese. ? People who are African American.  If you are 80-51 years of age, have your blood pressure checked every 3-5 years. If you are 31 years of age or older, have your blood pressure checked every year. You should have your blood pressure measured twice-once when you are at a hospital or clinic, and once when you are not at a hospital or clinic. Record the average of the two measurements. To check your blood pressure when you are not at a hospital or clinic, you can use: ? An automated blood pressure machine at a pharmacy. ? A home blood pressure monitor.  If you are between 5 years and 75 years old, ask your health care provider if you should take aspirin to prevent strokes.  Have regular diabetes screenings. This involves taking a blood sample to check your fasting blood sugar level. ? If you are at a normal weight and have a low risk for diabetes, have this test once every three years after 44 years of age. ? If you are overweight and have a high risk for diabetes, consider being tested at a younger age or more often. Preventing infection Hepatitis B  If you have a higher risk for hepatitis B, you should be screened for this virus. You are  considered at high risk for hepatitis B if: ? You were born in a country where hepatitis B is common. Ask your health care provider which countries are considered high risk. ? Your parents were born in a high-risk country, and you have not been immunized against hepatitis B (hepatitis B vaccine). ? You have HIV or AIDS. ? You use  needles to inject street drugs. ? You live with someone who has hepatitis B. ? You have had sex with someone who has hepatitis B. ? You get hemodialysis treatment. ? You take certain medicines for conditions, including cancer, organ transplantation, and autoimmune conditions.  Hepatitis C  Blood testing is recommended for: ? Everyone born from [birth year] through 1967. ? Anyone with known risk factors for hepatitis C.  Sexually transmitted infections (STIs)  You should be screened for sexually transmitted infections (STIs) including gonorrhea and chlamydia if: ? You are sexually active and are younger than 44 years of age. ? You are older than 44 years of age and your health care provider tells you that you are at risk for this type of infection. ? Your sexual activity has changed since you were last screened and you are at an increased risk for chlamydia or gonorrhea. Ask your health care provider if you are at risk.  If you do not have HIV, but are at risk, it may be recommended that you take a prescription medicine daily to prevent HIV infection. This is called pre-exposure prophylaxis (PrEP). You are considered at risk if: ? You are sexually active and do not regularly use condoms or know the HIV status of your partner(s). ? You take drugs by injection. ? You are sexually active with a partner who has HIV.  Talk with your health care provider about whether you are at high risk of being infected with HIV. If you choose to begin PrEP, you should first be tested for HIV. You should then be tested every 3 months for as long as you are taking PrEP. Pregnancy  If you  are premenopausal and you may become pregnant, ask your health care provider about preconception counseling.  If you may become pregnant, take 400 to 800 micrograms (mcg) of folic acid every day.  If you want to prevent pregnancy, talk to your health care provider about birth control (contraception). Osteoporosis and menopause  Osteoporosis is a disease in which the bones lose minerals and strength with aging. This can result in serious bone fractures. Your risk for osteoporosis can be identified using a bone density scan.  If you are 36 years of age or older, or if you are at risk for osteoporosis and fractures, ask your health care provider if you should be screened.  Ask your health care provider whether you should take a calcium or vitamin D supplement to lower your risk for osteoporosis.  Menopause may have certain physical symptoms and risks.  Hormone replacement therapy may reduce some of these symptoms and risks. Talk to your health care provider about whether hormone replacement therapy is right for you. Follow these instructions at home:  Schedule regular health, dental, and eye exams.  Stay current with your immunizations.  Do not use any tobacco products including cigarettes, chewing tobacco, or electronic cigarettes.  If you are pregnant, do not drink alcohol.  If you are breastfeeding, limit how much and how often you drink alcohol.  Limit alcohol intake to no more than 1 drink per day for nonpregnant women. One drink equals 12 ounces of beer, 5 ounces of wine, or 1 ounces of hard liquor.  Do not use street drugs.  Do not share needles.  Ask your health care provider for help if you need support or information about quitting drugs.  Tell your health care provider if you often feel depressed.  Tell your health care provider if you have ever been abused  or do not feel safe at home. This information is not intended to replace advice given to you by your health care  provider. Make sure you discuss any questions you have with your health care provider. Document Released: 11/04/2010 Document Revised: 09/27/2015 Document Reviewed: 01/23/2015 Elsevier Interactive Patient Education  [employer].  Thank you for coming in today. I hope you feel we met your needs.  Feel free to call PCP if you have any questions or further requests.  Please consider signing up for MyChart if you do not already have it, as this is a great way to communicate with me.  Best,  Whitney McVey, PA-C  IF you received an x-ray today, you will receive an invoice from [facility]. Please contact [facility] at [phone] with questions or concerns regarding your invoice.   IF you received labwork today, you will receive an invoice from [facility]. Please contact [facility] at [phone] with questions or concerns regarding your invoice.   Our billing staff will not be able to assist you with questions regarding bills from these companies.  You will be contacted with the lab results as soon as they are available. The fastest way to get your results is to activate your My Chart account. Instructions are located on the last page of this paperwork. If you have not heard from [place] regarding the results in 2 weeks, please contact this office.

## 2017-08-17 NOTE — Progress Notes (Signed)
[facility] [place], [place] [address] [phone]  Date:  08/17/2017   Name:  Meghan [name]   DOB:  Nov 19, 1975   MRN:  [id number]  PCP:  No Pcp, Per Patient (Inactive)    Chief Complaint: Annual Exam (no pap)   History of Present Illness:  This is a 42 y.o. female with PMH vitamin d deficiency, eczema and seasonal allergies who is presenting for CPE. She is from [place].   Vitamin D deficiency. She was told after last annual exam in 2017 that her vitamin D level was low and start supplement. She never started it, never followed-up.   Lymph node under left jaw. Was there about six years ago then went away. Has been present for about 2 weeks.   Medication refill of birth control and flonase.   Complaints: eczema on her hands  - desonide cream is not working.  LMP: 5 days ago.  Contraception: Verdon Cummins  Last pap: 2017 negative. Next PAP in 2022.  Sexual history: Married x 20 years Immunizations: utd Dentist: regular appointments.  Eye: 20/25 b/l vision.  Diet/Exercise: she cooks at home. Eats vegetables, friuts. Eats red meat 2-3 x/week.  Fam hx: family history is not on file. Tobacco/alcohol/substance use: non smoker, no drug use, no alcohol use.    Review of Systems:  Review of Systems  Constitutional: Negative for chills, diaphoresis, fatigue and fever.  HENT: Negative for congestion, postnasal drip, rhinorrhea, sinus pressure, sneezing and sore throat.   Respiratory: Negative for cough, chest tightness, shortness of breath and wheezing.   Cardiovascular: Negative for chest pain and palpitations.  Gastrointestinal: Negative for abdominal pain, diarrhea, nausea and vomiting.  Genitourinary: Negative for decreased urine volume, difficulty urinating, dysuria, enuresis, flank pain, frequency, hematuria and urgency.  Musculoskeletal: Negative for back pain.  Skin: Positive for rash.  Neurological: Negative for dizziness, weakness, light-headedness and  headaches.    There are no active problems to display for this patient.   Prior to Admission medications   Medication Sig Start Date End Date Taking? Authorizing Provider  fluticasone (FLONASE) 50 MCG/ACT nasal spray Place 2 sprays into both nostrils daily. 04/16/16  Yes Copland, Gay Filler, MD  Norethindrone-Ethinyl Estradiol Triphasic (LEENA) 0.5/1/0.5-35 MG-MCG tablet Take 1 tablet by mouth daily. NEEDS OFFICE VISIT FOR ADDITIONAL REFILLS 05/25/17  Yes Lindaann Gradilla, Gelene Mink, PA-C  desonide (DESOWEN) 0.05 % cream Apply topically 2 (two) times daily. Patient not taking: Reported on 08/17/2017 05/01/16   Levora Werden, Gelene Mink, PA-C    No Known Allergies  No past surgical history on file.  Social History   Tobacco Use  . Smoking status: Never Smoker  . Smokeless tobacco: Never Used  Substance Use Topics  . Alcohol use: No  . Drug use: No    No family history on file.  Medication list has been reviewed and updated.  Physical Examination:  Physical Exam  Constitutional: She is oriented to person, place, and time. She appears well-developed and well-nourished. No distress.  HENT:  Head: Normocephalic and atraumatic.  Mouth/Throat: Oropharynx is clear and moist.  Eyes: Pupils are equal, round, and reactive to light. Conjunctivae and EOM are normal.  Cardiovascular: Normal rate, regular rhythm and normal heart sounds.  No murmur heard. Pulmonary/Chest: Effort normal and breath sounds normal. She has no wheezes.  Musculoskeletal: Normal range of motion.  Lymphadenopathy:       Head (right side): Submandibular adenopathy present.       Head (left side): Submandibular  adenopathy present.  Neurological: She is alert and oriented to person, place, and time. She has normal reflexes.  Skin: Skin is warm and dry. Rash (right posterior hand) noted.  Ink stains on all fingertips and soles of feet  Psychiatric: She has a normal mood and affect. Her behavior is normal. Judgment and  thought content normal.  Vitals reviewed.   BP 110/70   Pulse 69   Temp 98.3 F (36.8 C) (Oral)   Resp 16   Ht '5\' 4"'  (1.626 m)   Wt 174 lb 3.2 oz (79 kg)   LMP 08/05/2017   SpO2 100%   BMI 29.90 kg/m   Assessment and Plan: 1. Annual physical exam - Pt presents for annual exam. She was told after last annual exam in 2017 that her vitamin D level was low and start supplement. She never started it, never followed-up. Advised pt to start 800-1,000 IU daily with calcium. Plan to recheck lab and will contact with results. Eczema treatment discussed. Routine labs are pending. Health maintenance provided. Will contact with results.  2. Vitamin D deficiency - VITAMIN D 25 Hydroxy (Vit-D Deficiency, Fractures)  3. Screening for endocrine, nutritional, metabolic and immunity disorder - CMP14+EGFR - VITAMIN D 25 Hydroxy (Vit-D Deficiency, Fractures) - CBC with Differential/Platelet - POCT urinalysis dipstick  4. Other seasonal allergic rhinitis - fluticasone (FLONASE) 50 MCG/ACT nasal spray; Place 2 sprays into both nostrils daily.  Dispense: 16 g; Refill: 6  5. Encounter for surveillance of contraceptive pills - Norethindrone-Ethinyl Estradiol Triphasic (LEENA) 0.5/1/0.5-35 MG-MCG tablet; Take 1 tablet by mouth daily. NEEDS OFFICE VISIT FOR ADDITIONAL REFILLS  Dispense: 84 tablet; Refill: 0  6. Eczema, unspecified type - triamcinolone cream (KENALOG) 0.1 %; Apply 1 application topically 2 (two) times daily.  Dispense: 30 g; Refill: 0  7. Screening, lipid - Lipid panel   Mercer Pod, PA-C  [facility] [place] 08/17/2017 8:22 AM

## 2017-08-18 LAB — CBC WITH DIFFERENTIAL/PLATELET
Basophils Absolute: 0 10*3/uL (ref 0.0–0.2)
Basos: 1 %
EOS (ABSOLUTE): 0.4 10*3/uL (ref 0.0–0.4)
Eos: 6 %
Hematocrit: 39.4 % (ref 34.0–46.6)
Hemoglobin: 12.9 g/dL (ref 11.1–15.9)
Immature Grans (Abs): 0 10*3/uL (ref 0.0–0.1)
Immature Granulocytes: 0 %
Lymphocytes Absolute: 2.8 10*3/uL (ref 0.7–3.1)
Lymphs: 45 %
MCH: 28 pg (ref 26.6–33.0)
MCHC: 32.7 g/dL (ref 31.5–35.7)
MCV: 86 fL (ref 79–97)
Monocytes Absolute: 0.4 10*3/uL (ref 0.1–0.9)
Monocytes: 7 %
Neutrophils Absolute: 2.5 10*3/uL (ref 1.4–7.0)
Neutrophils: 41 %
Platelets: 279 10*3/uL (ref 150–379)
RBC: 4.61 x10E6/uL (ref 3.77–5.28)
RDW: 14 % (ref 12.3–15.4)
WBC: 6.2 10*3/uL (ref 3.4–10.8)

## 2017-08-18 LAB — LIPID PANEL
Chol/HDL Ratio: 3.1 ratio (ref 0.0–4.4)
Cholesterol, Total: 162 mg/dL (ref 100–199)
HDL: 52 mg/dL (ref >39)
LDL Calculated: 87 mg/dL (ref 0–99)
Triglycerides: 115 mg/dL (ref 0–149)
VLDL Cholesterol Cal: 23 mg/dL (ref 5–40)

## 2017-08-18 LAB — CMP14+EGFR
ALT: 10 IU/L (ref 0–32)
AST: 16 IU/L (ref 0–40)
Albumin/Globulin Ratio: 1.3 (ref 1.2–2.2)
Albumin: 3.9 g/dL (ref 3.5–5.5)
Alkaline Phosphatase: 73 IU/L (ref 39–117)
BUN/Creatinine Ratio: 19 (ref 9–23)
BUN: 12 mg/dL (ref 6–24)
Bilirubin Total: <0.2 mg/dL (ref 0.0–1.2)
CO2: 21 mmol/L (ref 20–29)
Calcium: 8.7 mg/dL (ref 8.7–10.2)
Chloride: 103 mmol/L (ref 96–106)
Creatinine, Ser: 0.64 mg/dL (ref 0.57–1.00)
GFR calc Af Amer: 126 mL/min/{1.73_m2} (ref >59)
GFR calc non Af Amer: 109 mL/min/{1.73_m2} (ref >59)
Globulin, Total: 2.9 g/dL (ref 1.5–4.5)
Glucose: 88 mg/dL (ref 65–99)
Potassium: 4.2 mmol/L (ref 3.5–5.2)
Sodium: 137 mmol/L (ref 134–144)
Total Protein: 6.8 g/dL (ref 6.0–8.5)

## 2017-08-18 LAB — VITAMIN D 25 HYDROXY (VIT D DEFICIENCY, FRACTURES): Vit D, 25-Hydroxy: 18.7 ng/mL — ABNORMAL LOW (ref 30.0–100.0)

## 2017-08-18 NOTE — Progress Notes (Signed)
Scheduling: Please call pt and schedule an appointment for follow up vitamin D level in 3 months. Thank you!   Lab: Please mail result letter:  For Vitamin D: Start taking a Vitamin D3 supplement with 800 - 1,000 international units (IU) daily.  Calcium-All patients should maintain a daily total calcium intake (diet plus supplement) of 1000 mg (for ages 3919 to 70 years) to 1200 mg (for women ages 5751 through 3070 years and all adults 5771 years and older). The Upper Level (UL) of intake for calcium in most adults is 2000 to 2500 mg daily.  Come back and see me in 3-4 months to recheck.  What is a vitamin D deficiency?-A vitamin D deficiency is when you do not have enough vitamin D in your body. This is a problem, because the body needs vitamin D to absorb calcium and for other important jobs. People who do not have enough vitamin D can: ?Have weak or soft bones, which can break easily or change in shape ?Have weak muscles, which makes them more likely to fall  What foods and drinks have vitamin D?-Foods and drinks that have a lot of vitamin D include: -Milk, orange juice, or yogurt with vitamin D added -Cooked salmon or mackerel -Canned tuna fish -Cereals with vitamin D added -Cod liver oil People's bodies can also get vitamin D from the sun. The body uses sunlight that shines on the skin to make vitamin D.  It is important not to take too much vitamin D. Taking too much vitamin D can make you sick.  The rest of your labs look great!

## 2017-08-19 ENCOUNTER — Encounter: Payer: Self-pay | Admitting: Radiology

## 2017-10-20 ENCOUNTER — Ambulatory Visit: Payer: BLUE CROSS/BLUE SHIELD | Admitting: Physician Assistant

## 2017-10-20 ENCOUNTER — Encounter: Payer: Self-pay | Admitting: Physician Assistant

## 2017-10-20 ENCOUNTER — Other Ambulatory Visit: Payer: Self-pay

## 2017-10-20 VITALS — BP 110/60 | HR 88 | Temp 98.4°F | Ht 63.78 in | Wt 164.0 lb

## 2017-10-20 DIAGNOSIS — M549 Dorsalgia, unspecified: Secondary | ICD-10-CM | POA: Diagnosis not present

## 2017-10-20 MED ORDER — MELOXICAM 7.5 MG PO TABS: 7.5000 mg | ORAL_TABLET | Freq: Every day | ORAL | 1 refills | Status: AC

## 2017-10-20 MED ORDER — CYCLOBENZAPRINE HCL 10 MG PO TABS: 10.0000 mg | ORAL_TABLET | Freq: Three times a day (TID) | ORAL | 0 refills | Status: DC | PRN

## 2017-10-20 NOTE — Patient Instructions (Addendum)
Meloxicam is an NSAID. Do not use with any other otc pain medication other than tylenol/acetaminophen - so no aleve, ibuprofen, motrin, advil, etc. Flexeril is a muscle relaxer. This may make you drowsy. You may take this with Meloxicam.     Drink at least 32 oz water/daily.   Apply moist heat to the area. Wet a towel and wring it out so it is damp. Put it in the microwave for about 15-20 seconds - long enough to make it hot, but not too hot to apply to your skin causing burns. Do this for about 20-30 minutes, 3-4 times a day.   Perform gentle, light stretches 2-3 times a day.    Put a tennis ball between your back and a wall. Gentle massage the area with rolling the ball around the affected area or try a foam roller.   Come back if you are not improving in 2-3 weeks.   Thank you for coming in today. I hope you feel we met your needs.  Feel free to call PCP if you have any questions or further requests.  Please consider signing up for MyChart if you do not already have it, as this is a great way to communicate with me.  Best,  Whitney McVey, PA-C   IF you received an x-ray today, you will receive an invoice from [facility]. Please contact [facility] at [phone] with questions or concerns regarding your invoice.   IF you received labwork today, you will receive an invoice from [facility]. Please contact [facility] at [phone] with questions or concerns regarding your invoice.   Our billing staff will not be able to assist you with questions regarding bills from these companies.  You will be contacted with the lab results as soon as they are available. The fastest way to get your results is to activate your My Chart account. Instructions are located on the last page of this paperwork. If you have not heard from [place] regarding the results in 2 weeks, please contact this office.

## 2017-10-20 NOTE — Progress Notes (Signed)
   Aliene Altesahani H Tallon  MRN: [id number] DOB: 04/23/1976  PCP: Sebastian AcheMcVey, Senai Ramnath Whitney, PA-C  Subjective:  Pt is a 42 year old female who presents to clinic for side pain x 5 days.  Pain is of her right side. starts under the ribs on the side and goes down to lower back and down the frnt and bck of rt thigh  Pain of her right side. Feels like "sharp".  Denies changes with bowel movements, straining to have a bowel movements, cough, shob, fever, chills.    Review of Systems  Gastrointestinal: Negative for constipation, diarrhea, nausea and vomiting.  Genitourinary: Positive for flank pain. Negative for dysuria, frequency and urgency.  Musculoskeletal: Positive for arthralgias and back pain.    Patient Active Problem List   Diagnosis Date Noted  . Vitamin D deficiency 08/17/2017  . Eczema 08/17/2017    Current Outpatient Medications on File Prior to Visit  Medication Sig Dispense Refill  . fluticasone (FLONASE) 50 MCG/ACT nasal spray Place 2 sprays into both nostrils daily. 16 g 6  . Norethindrone-Ethinyl Estradiol Triphasic (LEENA) 0.5/1/0.5-35 MG-MCG tablet Take 1 tablet by mouth daily. NEEDS OFFICE VISIT FOR ADDITIONAL REFILLS 84 tablet 0  . triamcinolone cream (KENALOG) 0.1 % Apply 1 application topically 2 (two) times daily. 30 g 0   No current facility-administered medications on file prior to visit.     No Known Allergies   Objective:  BP 110/60 (BP Location: Left Arm, Patient Position: Sitting, Cuff Size: Normal)   Pulse 88   Temp 98.4 F (36.9 C) (Oral)   Ht 5' 3.78" (1.62 m)   Wt 164 lb (74.4 kg)   SpO2 99%   BMI 28.35 kg/m   Physical Exam  Abdominal: Soft. Normal appearance. There is no tenderness. There is no CVA tenderness.  Musculoskeletal:       Lumbar back: She exhibits tenderness. She exhibits no bony tenderness.       Back:    Dry needling was performed at the site of maximal tenderness using a 25 gage needle. This was well tolerated, and followed by  moderate relief of pain.  Assessment and Plan :  1. Musculoskeletal back pain -Patient presents complaining of back pain for the past few days.  No red flags.  Physical exam reveals tight muscles of the right side of her back.  Dry needling session performed with moderate relief of pain.  Advised heat, hydration, stretching.  We will try Mobic and Flexeril.  Return to clinic as needed - meloxicam (MOBIC) 7.5 MG tablet; Take 1-2 tablets (7.5-15 mg total) by mouth daily.  Dispense: 30 tablet; Refill: 1 - cyclobenzaprine (FLEXERIL) 10 MG tablet; Take 1 tablet (10 mg total) by mouth 3 (three) times daily as needed for muscle spasms.  Dispense: 30 tablet; Refill: 0  Marco CollieWhitney Friedrich Harriott, PA-C  Primary Care at [facility] 10/20/2017 4:37 PM

## 2017-11-06 ENCOUNTER — Other Ambulatory Visit: Payer: Self-pay | Admitting: Physician Assistant

## 2017-11-06 DIAGNOSIS — Z3041 Encounter for surveillance of contraceptive pills: Secondary | ICD-10-CM

## 2017-12-29 ENCOUNTER — Encounter: Payer: Self-pay | Admitting: Physician Assistant

## 2017-12-29 ENCOUNTER — Ambulatory Visit: Payer: BLUE CROSS/BLUE SHIELD | Admitting: Physician Assistant

## 2017-12-29 VITALS — BP 107/73 | HR 84 | Temp 98.8°F | Resp 17 | Ht 64.0 in | Wt 156.0 lb

## 2017-12-29 DIAGNOSIS — E559 Vitamin D deficiency, unspecified: Secondary | ICD-10-CM | POA: Diagnosis not present

## 2017-12-29 DIAGNOSIS — J22 Unspecified acute lower respiratory infection: Secondary | ICD-10-CM | POA: Diagnosis not present

## 2017-12-29 DIAGNOSIS — Z3041 Encounter for surveillance of contraceptive pills: Secondary | ICD-10-CM | POA: Diagnosis not present

## 2017-12-29 DIAGNOSIS — R059 Cough, unspecified: Secondary | ICD-10-CM

## 2017-12-29 DIAGNOSIS — R05 Cough: Secondary | ICD-10-CM

## 2017-12-29 MED ORDER — BENZONATATE 100 MG PO CAPS: 100.0000 mg | ORAL_CAPSULE | Freq: Three times a day (TID) | ORAL | 0 refills | Status: DC | PRN

## 2017-12-29 MED ORDER — HYDROCODONE-HOMATROPINE 5-1.5 MG/5ML PO SYRP: 5.0000 mL | ORAL_SOLUTION | Freq: Three times a day (TID) | ORAL | 0 refills | Status: DC | PRN

## 2017-12-29 MED ORDER — GUAIFENESIN ER 1200 MG PO TB12: 1.0000 | ORAL_TABLET | Freq: Two times a day (BID) | ORAL | 1 refills | Status: DC | PRN

## 2017-12-29 MED ORDER — NORETHIN-ETH ESTRAD TRIPHASIC 0.5/1/0.5-35 MG-MCG PO TABS: 1.0000 | ORAL_TABLET | Freq: Every day | ORAL | 4 refills | Status: DC

## 2017-12-29 MED ORDER — AZITHROMYCIN 250 MG PO TABS: ORAL_TABLET | ORAL | 0 refills | Status: DC

## 2017-12-29 NOTE — Patient Instructions (Addendum)
1) see below for cough information. Come back if you are not improving in 5-7 days.   2) You have a vitamin D deficiency and need to take a daily supplement to help keep your bones strong.  Continue taking 800 international units of vitamin D3 a day.   Calcium-All patients should maintain a daily total calcium intake (diet plus supplement) of 1000 mg/day. (The Upper Level (UL) of intake for calcium in most adults is 2000 to 2500 mg daily.)  What foods and drinks have vitamin D?-Foods and drinks that have a lot of vitamin D include: -Milk, orange juice, or yogurt with vitamin D added -Cooked salmon or mackerel -Canned tuna fish -Cereals with vitamin D added -Cod liver oil People's bodies can also get vitamin D from the sun. The body uses sunlight that shines on the skin to make vitamin D. Vitamin D Deficiency Vitamin D deficiency is when your body does not have enough vitamin D. Vitamin D is important to your body for many reasons:  It helps the body to absorb two important minerals, called calcium and phosphorus.  It plays a role in bone health.  It may help to prevent some diseases, such as diabetes and multiple sclerosis.  It plays a role in muscle function, including heart function.  You can get vitamin D by:  Eating foods that naturally contain vitamin D.  Eating or drinking milk or other dairy products that have vitamin D added to them.  Taking a vitamin D supplement or a multivitamin supplement that contains vitamin D.  Being in the sun. Your body naturally makes vitamin D when your skin is exposed to sunlight. Your body changes the sunlight into a form of the vitamin that the body can use.  If vitamin D deficiency is severe, it can cause a condition in which your bones become soft. In adults, this condition is called osteomalacia. In children, this condition is called rickets. What are the causes? Vitamin D deficiency may be caused by:  Not eating enough foods that  contain vitamin D.  Not getting enough sun exposure.  Having certain digestive system diseases that make it difficult for your body to absorb vitamin D. These diseases include Crohn disease, chronic pancreatitis, and cystic fibrosis.  Having a surgery in which a part of the stomach or a part of the small intestine is removed.  Being obese.  Having chronic kidney disease or liver disease.  What increases the risk? This condition is more likely to develop in:  Older people.  People who do not spend much time outdoors.  People who live in a long-term care facility.  People who have had broken bones.  People with weak or thin bones (osteoporosis).  People who have a disease or condition that changes how the body absorbs vitamin D.  People who have dark skin.  People who take certain medicines, such as steroid medicines or certain seizure medicines.  People who are overweight or obese.  What are the signs or symptoms? In mild cases of vitamin D deficiency, there may not be any symptoms. If the condition is severe, symptoms may include:  Bone pain.  Muscle pain.  Falling often.  Broken bones caused by a minor injury.  How is this diagnosed? This condition is usually diagnosed with a blood test. How is this treated? Treatment for this condition may depend on what caused the condition. Treatment options include:  Taking vitamin D supplements.  Taking a calcium supplement. Your health care provider  will suggest what dose is best for you.  Follow these instructions at home:  Take medicines and supplements only as told by your health care provider.  Eat foods that contain vitamin D. Choices include: ? Fortified dairy products, cereals, or juices. Fortified means that vitamin D has been added to the food. Check the label on the package to be sure. ? Fatty fish, such as salmon or trout. ? Eggs. ? Oysters.  Do not use a tanning bed.  Maintain a healthy weight. Lose  weight, if needed.  Keep all follow-up visits as told by your health care provider. This is important. Contact a health care provider if:  Your symptoms do not go away.  You feel like throwing up (nausea) or you throw up (vomit).  You have fewer bowel movements than usual or it is difficult for you to have a bowel movement (constipation). This information is not intended to replace advice given to you by your health care provider. Make sure you discuss any questions you have with your health care provider. Document Released: 07/14/2011 Document Revised: 10/03/2015 Document Reviewed: 09/06/2014 Elsevier Interactive Patient Education  2018 [employer]  -------------------------------------------------------------------------  Azithromycin is an antibiotic. Take the entire course of this medication, even if you start to feel better sooner.  Tessalon is for cough during the day. This should not make you drowsy.  Hycodan is to help your cough at night. This will make you drowsy. Do not take this with any other medications that make your drowsy.  Mucinex will help thin out your mucus to help you clear it out. Drink plenty of water while taking this medication.   Stay well hydrated. Get lost of rest. Wash your hands often.   -Foods that can help speed recovery: honey, garlic, chicken soup, elderberries, green tea.  -Supplements that can help speed recovery: vitamin C, zinc, elderberry extract, quercetin, ginseng, selenium -Supplement with prebiotics and probiotics:   Advil or ibuprofen for pain. Do not take Aspirin.  Drink enough water and fluids to keep your urine clear or pale yellow.  For sore throat: ? Gargle with 8 oz of salt water ( tsp of salt per 1 qt of water) as often as every 1-2 hours to soothe your throat.  Gargle liquid benadryl.  Cepacol throat lozenges (if you are not at risk for choking).  For sore throat try using a honey-based tea. Use 3 teaspoons of honey with  juice squeezed from half lemon. Place shaved pieces of ginger into 1/2-1 cup of water and warm over stove top. Then mix the ingredients and repeat every 4 hours as needed.  Cough Syrup Recipe: Sweet Lemon & Honey Thyme  Ingredients . a handful of fresh thyme sprigs   . 1 pint of water (2 cups)  . 1/2 cup honey (raw is best, but regular will do)  . 1/2 lemon chopped Instructions 1. Place the lemon in the pint jar and cover with the honey. The honey will macerate the lemons and draw out liquids which taste so delicious! 2. Meanwhile, toss the thyme leaves into a saucepan and cover them with the water. 3. Bring the water to a gentle simmer and reduce it to half, about a cup of tea. 4. When the tea is reduced and cooled a bit, strain the sprigs & leaves, add it into the pint jar and stir it well. 5. Give it a shake and use a spoonful as needed. 6. Store your homemade cough syrup in the refrigerator for  about a month.  What causes a cough?  In adults, common causes of a cough include: ?An infection of the airways or lungs (such as the common cold) ?Postnasal drip - Postnasal drip is when mucus from the nose drips down or flows along the back of the throat. Postnasal drip can happen when people have: .A cold .Allergies .A sinus infection - The sinuses are hollow areas in the bones of the face that open into the nose. ?Lung conditions, like asthma and chronic obstructive pulmonary disease (COPD) - Both of these conditions can make it hard to breathe. COPD is usually caused by smoking. ?Acid reflux - Acid reflux is when the acid that is normally in your stomach backs up into your esophagus (the tube that carries food from your mouth to your stomach). ?A side effect from blood pressure medicines called "ACE inhibitors" ?Smoking cigarettes  Is there anything I can do on my own to get rid of my cough?  Yes. To help get rid of your cough, you can: ?Use a humidifier in your bedroom ?Use an  over-the-counter cough medicine, or suck on cough drops or hard candy ?Stop smoking, if you smoke ?If you have allergies, avoid the things you are allergic to (like pollen, dust, animals, or mold) If you have acid reflux, your doctor or nurse will tell you which lifestyle changes can help reduce symptoms.    If you have lab work done today you will be contacted with your lab results within the next 2 weeks.  If you have not heard from [place] then please contact us. The fastest way to get your results is to register for My Chart.   IF you received an x-ray today, you will receive an invoice from [facility]. Please contact [facility] at [phone] with questions or concerns regarding your invoice.   IF you received labwork today, you will receive an invoice from [place] [facility]. Please contact [facility] at [phone] with questions or concerns regarding your invoice.   Our billing staff will not be able to assist you with questions regarding bills from these companies.  You will be contacted with the lab results as soon as they are available. The fastest way to get your results is to activate your My Chart account. Instructions are located on the last page of this paperwork. If you have not heard from [place] regarding the results in 2 weeks, please contact this office.

## 2017-12-29 NOTE — Progress Notes (Signed)
Meghan [name]  MRN: [id number] DOB: 07/25/1975  PCP: Sebastian AcheMcVey, Dayami Taitt Whitney, PA-C  Subjective:  Pt is a 42 year old female who presents to clinic for sickness and f/u vitamin D deficiency.  Labs from annual exam 4/15 showed Vit D level of 18.7.  She has been taking vitamin D supplement most days for about 1 month. She was advised after last annual exam to start vitamin D3. She has been advised to take supplement in the past but did not.    Endorses feeling sick today. Cough x two weeks. Cough is productive. Cough keeps her up at night. Endorses feeling fatigue.  She has been taking tylenol.   Review of Systems  Constitutional: Positive for fatigue. Negative for chills, diaphoresis and fever.  HENT: Positive for congestion. Negative for postnasal drip, rhinorrhea, sinus pain, sneezing and sore throat.   Respiratory: Positive for cough. Negative for chest tightness, shortness of breath and wheezing.   Cardiovascular: Negative for chest pain and palpitations.    Patient Active Problem List   Diagnosis Date Noted  . Vitamin D deficiency 08/17/2017  . Eczema 08/17/2017    Current Outpatient Medications on File Prior to Visit  Medication Sig Dispense Refill  . ARANELLE 0.5/1/0.5-35 MG-MCG tablet TAKE 1 TABLET BY MOUTH DAILY 84 tablet 0  . cyclobenzaprine (FLEXERIL) 10 MG tablet Take 1 tablet (10 mg total) by mouth 3 (three) times daily as needed for muscle spasms. 30 tablet 0  . fluticasone (FLONASE) 50 MCG/ACT nasal spray Place 2 sprays into both nostrils daily. 16 g 6  . meloxicam (MOBIC) 7.5 MG tablet Take 1-2 tablets (7.5-15 mg total) by mouth daily. 30 tablet 1  . triamcinolone cream (KENALOG) 0.1 % Apply 1 application topically 2 (two) times daily. 30 g 0   No current facility-administered medications on file prior to visit.     No Known Allergies   Objective:  BP 107/73   Pulse 84   Temp 98.8 F (37.1 C) (Oral)   Resp 17   Ht 5\' 4"  (1.626 m)   Wt 156 lb (70.8 kg)    LMP 12/22/2017 (Approximate)   SpO2 98%   BMI 26.78 kg/m   Physical Exam  Constitutional: She is oriented to person, place, and time. No distress.  HENT:  Right Ear: Tympanic membrane normal.  Left Ear: Tympanic membrane normal.  Nose: Mucosal edema present. No rhinorrhea. Right sinus exhibits no maxillary sinus tenderness and no frontal sinus tenderness. Left sinus exhibits no maxillary sinus tenderness and no frontal sinus tenderness.  Mouth/Throat: Oropharynx is clear and moist and mucous membranes are normal.  Cardiovascular: Normal rate, regular rhythm and normal heart sounds.  Pulmonary/Chest: Effort normal and breath sounds normal. No respiratory distress. She has no wheezes. She has no rales.  Neurological: She is alert and oriented to person, place, and time.  Skin: Skin is warm and dry.  Psychiatric: Judgment normal.  Vitals reviewed.   Assessment and Plan :  1. Vitamin D deficiency - pt presents for f/u vitamin D deficiency. She was advised to take 800-1,000 units. She has been taking vit D for about one month. Advised con't supplement, will contact with results and plan.  - VITAMIN D 25 Hydroxy (Vit-D Deficiency, Fractures)  2. Cough - HYDROcodone-homatropine (HYCODAN) 5-1.5 MG/5ML syrup; Take 5 mLs by mouth every 8 (eight) hours as needed for cough.  Dispense: 120 mL; Refill: 0 - benzonatate (TESSALON) 100 MG capsule; Take 1-2 capsules (100-200 mg total) by mouth 3 (  three) times daily as needed for cough.  Dispense: 40 capsule; Refill: 0 - Guaifenesin (MUCINEX MAXIMUM STRENGTH) 1200 MG TB12; Take 1 tablet (1,200 mg total) by mouth every 12 (twelve) hours as needed.  Dispense: 14 tablet; Refill: 1  3. Encounter for surveillance of contraceptive pills - Norethindrone-Ethinyl Estradiol Triphasic (ARANELLE) 0.5/1/0.5-35 MG-MCG tablet; Take 1 tablet by mouth daily.  Dispense: 84 tablet; Refill: 4  4. Lower respiratory infection - azithromycin (ZITHROMAX) 250 MG tablet;  Take 2 tabs PO x 1 dose, then 1 tab PO QD x 4 days  Dispense: 6 tablet; Refill: 0   Marco Collie, PA-C  Primary Care at [facility] 12/29/2017 10:13 AM  Please note: Portions of this report may have been transcribed using dragon voice recognition software. Every effort was made to ensure accuracy; however, inadvertent computerized transcription errors may be present.

## 2017-12-30 LAB — VITAMIN D 25 HYDROXY (VIT D DEFICIENCY, FRACTURES): Vit D, 25-Hydroxy: 14.3 ng/mL — ABNORMAL LOW (ref 30.0–100.0)

## 2018-01-14 ENCOUNTER — Encounter: Payer: Self-pay | Admitting: Physician Assistant

## 2018-01-14 NOTE — Progress Notes (Signed)
Result letter sent in mail. Start taking vitamin D supplement 1,000 IU/day. Recheck in 3 months.

## 2018-03-24 ENCOUNTER — Telehealth: Payer: Self-pay | Admitting: Family Medicine

## 2018-03-24 ENCOUNTER — Encounter

## 2018-03-24 ENCOUNTER — Ambulatory Visit: Payer: BLUE CROSS/BLUE SHIELD | Admitting: Family Medicine

## 2018-03-24 NOTE — Telephone Encounter (Signed)
Called and spoke with pt regarding [facility] appt today at 4:00. [facility] was notified and cancelled. Pt stated that they will have to seek treatment elsewhere.

## 2018-03-26 ENCOUNTER — Ambulatory Visit: Payer: BLUE CROSS/BLUE SHIELD | Admitting: Family Medicine

## 2018-03-26 ENCOUNTER — Encounter: Payer: Self-pay | Admitting: Family Medicine

## 2018-03-26 ENCOUNTER — Other Ambulatory Visit: Payer: Self-pay

## 2018-03-26 VITALS — BP 119/74 | HR 82 | Temp 98.6°F | Ht 62.0 in | Wt 168.0 lb

## 2018-03-26 DIAGNOSIS — M79604 Pain in right leg: Secondary | ICD-10-CM

## 2018-03-26 DIAGNOSIS — M5431 Sciatica, right side: Secondary | ICD-10-CM | POA: Diagnosis not present

## 2018-03-26 MED ORDER — CYCLOBENZAPRINE HCL 5 MG PO TABS: ORAL_TABLET | ORAL | 0 refills | Status: AC

## 2018-03-26 NOTE — Patient Instructions (Addendum)
Pain appears to be due to sciatica.  Continue meloxicam 1 to 2/day, muscle relaxant at bedtime as needed, gentle range of motion and stretching as tolerated, heat or ice to affected area as needed.  If not improving in 1 week to 10 days return for recheck, sooner if worse.  Return to the clinic or go to the nearest emergency room if any of your symptoms worsen or new symptoms occur.   Sciatica Sciatica is pain, numbness, weakness, or tingling along the path of the sciatic nerve. The sciatic nerve starts in the lower back and runs down the back of each leg. The nerve controls the muscles in the lower leg and in the back of the knee. It also provides feeling (sensation) to the back of the thigh, the lower leg, and the sole of the foot. Sciatica is a symptom of another medical condition that pinches or puts pressure on the sciatic nerve. Generally, sciatica only affects one side of the body. Sciatica usually goes away on its own or with treatment. In some cases, sciatica may keep coming back (recur). What are the causes? This condition is caused by pressure on the sciatic nerve, or pinching of the sciatic nerve. This may be the result of:  A disk in between the bones of the spine (vertebrae) bulging out too far (herniated disk).  Age-related changes in the spinal disks (degenerative disk disease).  A pain disorder that affects a muscle in the buttock (piriformis syndrome).  Extra bone growth (bone spur) near the sciatic nerve.  An injury or break (fracture) of the pelvis.  Pregnancy.  Tumor (rare).  What increases the risk? The following factors may make you more likely to develop this condition:  Playing sports that place pressure or stress on the spine, such as football or weight lifting.  Having poor strength and flexibility.  A history of back injury.  A history of back surgery.  Sitting for long periods of time.  Doing activities that involve repetitive bending or  lifting.  Obesity.  What are the signs or symptoms? Symptoms can vary from mild to very severe, and they may include:  Any of these problems in the lower back, leg, hip, or buttock: ? Mild tingling or dull aches. ? Burning sensations. ? Sharp pains.  Numbness in the back of the calf or the sole of the foot.  Leg weakness.  Severe back pain that makes movement difficult.  These symptoms may get worse when you cough, sneeze, or laugh, or when you sit or stand for long periods of time. Being overweight may also make symptoms worse. In some cases, symptoms may recur over time. How is this diagnosed? This condition may be diagnosed based on:  Your symptoms.  A physical exam. Your health care provider may ask you to do certain movements to check whether those movements trigger your symptoms.  You may have tests, including: ? Blood tests. ? X-rays. ? MRI. ? CT scan.  How is this treated? In many cases, this condition improves on its own, without any treatment. However, treatment may include:  Reducing or modifying physical activity during periods of pain.  Exercising and stretching to strengthen your abdomen and improve the flexibility of your spine.  Icing and applying heat to the affected area.  Medicines that help: ? To relieve pain and swelling. ? To relax your muscles.  Injections of medicines that help to relieve pain, irritation, and inflammation around the sciatic nerve (steroids).  Surgery.  Follow these  instructions at home: Medicines  Take over-the-counter and prescription medicines only as told by your health care provider.  Do not drive or operate heavy machinery while taking prescription pain medicine. Managing pain  If directed, apply ice to the affected area. ? Put ice in a plastic bag. ? Place a towel between your skin and the bag. ? Leave the ice on for 20 minutes, 2-3 times a day.  After icing, apply heat to the affected area before you  exercise or as often as told by your health care provider. Use the heat source that your health care provider recommends, such as a moist heat pack or a heating pad. ? Place a towel between your skin and the heat source. ? Leave the heat on for 20-30 minutes. ? Remove the heat if your skin turns bright red. This is especially important if you are unable to feel pain, heat, or cold. You may have a greater risk of getting burned. Activity  Return to your normal activities as told by your health care provider. Ask your health care provider what activities are safe for you. ? Avoid activities that make your symptoms worse.  Take brief periods of rest throughout the day. Resting in a lying or standing position is usually better than sitting to rest. ? When you rest for longer periods, mix in some mild activity or stretching between periods of rest. This will help to prevent stiffness and pain. ? Avoid sitting for long periods of time without moving. Get up and move around at least one time each hour.  Exercise and stretch regularly, as told by your health care provider.  Do not lift anything that is heavier than 10 lb (4.5 kg) while you have symptoms of sciatica. When you do not have symptoms, you should still avoid heavy lifting, especially repetitive heavy lifting.  When you lift objects, always use proper lifting technique, which includes: ? Bending your knees. ? Keeping the load close to your body. ? Avoiding twisting. General instructions  Use good posture. ? Avoid leaning forward while sitting. ? Avoid hunching over while standing.  Maintain a healthy weight. Excess weight puts extra stress on your back and makes it difficult to maintain good posture.  Wear supportive, comfortable shoes. Avoid wearing high heels.  Avoid sleeping on a mattress that is too soft or too hard. A mattress that is firm enough to support your back when you sleep may help to reduce your pain.  Keep all  follow-up visits as told by your health care provider. This is important. Contact a health care provider if:  You have pain that wakes you up when you are sleeping.  You have pain that gets worse when you lie down.  Your pain is worse than you have experienced in the past.  Your pain lasts longer than 4 weeks.  You experience unexplained weight loss. Get help right away if:  You lose control of your bowel or bladder (incontinence).  You have: ? Weakness in your lower back, pelvis, buttocks, or legs that gets worse. ? Redness or swelling of your back. ? A burning sensation when you urinate. This information is not intended to replace advice given to you by your health care provider. Make sure you discuss any questions you have with your health care provider. Document Released: 04/15/2001 Document Revised: 09/25/2015 Document Reviewed: 12/29/2014 Elsevier Interactive Patient Education  [employer].    If you have lab work done today you will be contacted with  your lab results within the next 2 weeks.  If you have not heard from us then please contact us. The fastest way to get your results is to register for My Chart.   IF you received an x-ray today, you will receive an invoice from [facility]. Please contact [facility] at [phone] with questions or concerns regarding your invoice.   IF you received labwork today, you will receive an invoice from [facility]. Please contact [facility] at [phone] with questions or concerns regarding your invoice.   Our billing staff will not be able to assist you with questions regarding bills from these companies.  You will be contacted with the lab results as soon as they are available. The fastest way to get your results is to activate your My Chart account. Instructions are located on the last page of this paperwork. If you have not heard from us regarding the results in 2 weeks, please contact this office.

## 2018-03-26 NOTE — Progress Notes (Signed)
Subjective:  By signing my name below, I, Stann Ore, attest that this documentation has been prepared under the direction and in the presence of Meredith Staggers, MD. Electronically Signed: Stann Ore, Scribe. 03/26/2018 , 5:54 PM .  Patient was seen in Room 9 .   Patient ID: Meghan [name], female    DOB: 1976/01/25, 42 y.o.   MRN: [id number] Chief Complaint  Patient presents with  . Leg Pain    right leg pain   HPI Meghan [name] is a 42 y.o. female  Patient states low right back pain that radiates down her right leg. She informs having similar pain less than a year ago. This time, her pain started about a week ago. She describes pain located at low right back, and radiates down her right leg. She's had slight numbness in right lower extremity today. She also states the pain is worse in the mornings. She's been taking meloxicam 7.5 mg bid for the past 5 days. She denies urinary or bowel incontinence, weakness or saddle anesthesia. She describes having a grabbing sensation. In the past, she did well on muscle relaxer.   Her husband translated for her in the room, other interpreter not used. Understanding expressed.   Patient Active Problem List   Diagnosis Date Noted  . Vitamin D deficiency 08/17/2017  . Eczema 08/17/2017   Past Medical History:  Diagnosis Date  . Allergy    No past surgical history on file. No Known Allergies Prior to Admission medications   Medication Sig Start Date End Date Taking? Authorizing Provider  azithromycin (ZITHROMAX) 250 MG tablet Take 2 tabs PO x 1 dose, then 1 tab PO QD x 4 days 12/29/17   McVey, Madelaine Bhat, PA-C  benzonatate (TESSALON) 100 MG capsule Take 1-2 capsules (100-200 mg total) by mouth 3 (three) times daily as needed for cough. 12/29/17   McVey, Madelaine Bhat, PA-C  cyclobenzaprine (FLEXERIL) 10 MG tablet Take 1 tablet (10 mg total) by mouth 3 (three) times daily as needed for muscle spasms. 10/20/17   McVey, Madelaine Bhat, PA-C  fluticasone (FLONASE) 50 MCG/ACT nasal spray Place 2 sprays into both nostrils daily. 08/17/17   McVey, Madelaine Bhat, PA-C  Guaifenesin (MUCINEX MAXIMUM STRENGTH) 1200 MG TB12 Take 1 tablet (1,200 mg total) by mouth every 12 (twelve) hours as needed. 12/29/17   McVey, Madelaine Bhat, PA-C  HYDROcodone-homatropine (HYCODAN) 5-1.5 MG/5ML syrup Take 5 mLs by mouth every 8 (eight) hours as needed for cough. 12/29/17   McVey, Madelaine Bhat, PA-C  meloxicam (MOBIC) 7.5 MG tablet Take 1-2 tablets (7.5-15 mg total) by mouth daily. 10/20/17   McVey, Madelaine Bhat, PA-C  Norethindrone-Ethinyl Estradiol Triphasic (ARANELLE) 0.5/1/0.5-35 MG-MCG tablet Take 1 tablet by mouth daily. 12/29/17   McVey, Madelaine Bhat, PA-C  triamcinolone cream (KENALOG) 0.1 % Apply 1 application topically 2 (two) times daily. 08/17/17   McVey, Madelaine Bhat, PA-C   Social History   Socioeconomic History  . Marital status: Married    Spouse name: Not on file  . Number of children: Not on file  . Years of education: Not on file  . Highest education level: Not on file  Occupational History  . Not on file  Social Needs  . Financial resource strain: Not on file  . Food insecurity:    Worry: Not on file    Inability: Not on file  . Transportation needs:    Medical: Not on file    Non-medical: Not on file  Tobacco  Use  . Smoking status: Never Smoker  . Smokeless tobacco: Never Used  Substance and Sexual Activity  . Alcohol use: No  . Drug use: No  . Sexual activity: Yes    Birth control/protection: Pill  Lifestyle  . Physical activity:    Days per week: Not on file    Minutes per session: Not on file  . Stress: Not on file  Relationships  . Social connections:    Talks on phone: Not on file    Gets together: Not on file    Attends religious service: Not on file    Active member of club or organization: Not on file    Attends meetings of clubs or organizations: Not on file     Relationship status: Not on file  . Intimate partner violence:    Fear of current or ex partner: Not on file    Emotionally abused: Not on file    Physically abused: Not on file    Forced sexual activity: Not on file  Other Topics Concern  . Not on file  Social History Narrative  . Not on file   Review of Systems  Constitutional: Negative for chills, fatigue, fever and unexpected weight change.  Respiratory: Negative for cough.   Gastrointestinal: Negative for constipation, diarrhea, nausea and vomiting.  Musculoskeletal: Positive for back pain and myalgias (right leg).  Skin: Negative for rash and wound.  Neurological: Negative for dizziness, weakness, numbness and headaches.       Objective:   Physical Exam  Constitutional: She is oriented to person, place, and time. She appears well-developed and well-nourished. No distress.  HENT:  Head: Normocephalic and atraumatic.  Eyes: Pupils are equal, round, and reactive to light. EOM are normal.  Neck: Neck supple.  Cardiovascular: Normal rate.  Pulmonary/Chest: Effort normal. No respiratory distress.  Musculoskeletal: Normal range of motion.  Tenderness over right sciatic notch, slight tenderness over right paraspinals, somewhat guarded with toe walking due to pain but able to heel walk, guarded with flexion to 70 degrees, some right sided discomfort with left lateral flexion, there is right sided discomfort with rotation; positive seated straight leg raise on the right, no significant calf swelling appreciated, negative homan's  Neurological: She is alert and oriented to person, place, and time. She displays no Babinski's sign on the right side. She displays no Babinski's sign on the left side.  Reflex Scores:      Patellar reflexes are 2+ on the right side and 2+ on the left side.      Achilles reflexes are 2+ on the right side and 2+ on the left side. Skin: Skin is warm and dry.  Psychiatric: She has a normal mood and affect. Her  behavior is normal.  Nursing note and vitals reviewed.   Vitals:   03/26/18 1730  BP: 119/74  Pulse: 82  Temp: 98.6 F (37 C)  TempSrc: Oral  SpO2: 100%  Weight: 168 lb (76.2 kg)  Height: 5\' 2"  (1.575 m)       Assessment & Plan:    Meghan [name] is a 42 y.o. female Right leg pain - Plan: cyclobenzaprine (FLEXERIL) 5 MG tablet  Right sided sciatica - Plan: cyclobenzaprine (FLEXERIL) 5 MG tablet  Suspected right sided sciatica, no concerning findings on history or exam.  No known injury, imaging initially deferred.  -Trial of Flexeril in addition to continue meloxicam for now.  Potential side effects and risks of medications were discussed  -Heat or ice with  gentle range of motion stretching as tolerated  -recheck in the next week to 10 days if not improving for possible imaging and prednisone, sooner if worse.  Meds ordered this encounter  Medications  . cyclobenzaprine (FLEXERIL) 5 MG tablet    Sig: 1 pill by mouth up to every 8 hours as needed. Start with one pill by mouth each bedtime as needed due to sedation    Dispense:  15 tablet    Refill:  0   Patient Instructions   Pain appears to be due to sciatica.  Continue meloxicam 1 to 2/day, muscle relaxant at bedtime as needed, gentle range of motion and stretching as tolerated, heat or ice to affected area as needed.  If not improving in 1 week to 10 days return for recheck, sooner if worse.  Return to the clinic or go to the nearest emergency room if any of your symptoms worsen or new symptoms occur.   Sciatica Sciatica is pain, numbness, weakness, or tingling along the path of the sciatic nerve. The sciatic nerve starts in the lower back and runs down the back of each leg. The nerve controls the muscles in the lower leg and in the back of the knee. It also provides feeling (sensation) to the back of the thigh, the lower leg, and the sole of the foot. Sciatica is a symptom of another medical condition that pinches or  puts pressure on the sciatic nerve. Generally, sciatica only affects one side of the body. Sciatica usually goes away on its own or with treatment. In some cases, sciatica may keep coming back (recur). What are the causes? This condition is caused by pressure on the sciatic nerve, or pinching of the sciatic nerve. This may be the result of:  A disk in between the bones of the spine (vertebrae) bulging out too far (herniated disk).  Age-related changes in the spinal disks (degenerative disk disease).  A pain disorder that affects a muscle in the buttock (piriformis syndrome).  Extra bone growth (bone spur) near the sciatic nerve.  An injury or break (fracture) of the pelvis.  Pregnancy.  Tumor (rare).  What increases the risk? The following factors may make you more likely to develop this condition:  Playing sports that place pressure or stress on the spine, such as football or weight lifting.  Having poor strength and flexibility.  A history of back injury.  A history of back surgery.  Sitting for long periods of time.  Doing activities that involve repetitive bending or lifting.  Obesity.  What are the signs or symptoms? Symptoms can vary from mild to very severe, and they may include:  Any of these problems in the lower back, leg, hip, or buttock: ? Mild tingling or dull aches. ? Burning sensations. ? Sharp pains.  Numbness in the back of the calf or the sole of the foot.  Leg weakness.  Severe back pain that makes movement difficult.  These symptoms may get worse when you cough, sneeze, or laugh, or when you sit or stand for long periods of time. Being overweight may also make symptoms worse. In some cases, symptoms may recur over time. How is this diagnosed? This condition may be diagnosed based on:  Your symptoms.  A physical exam. Your health care provider may ask you to do certain movements to check whether those movements trigger your symptoms.  You  may have tests, including: ? Blood tests. ? X-rays. ? MRI. ? CT scan.  How is  this treated? In many cases, this condition improves on its own, without any treatment. However, treatment may include:  Reducing or modifying physical activity during periods of pain.  Exercising and stretching to strengthen your abdomen and improve the flexibility of your spine.  Icing and applying heat to the affected area.  Medicines that help: ? To relieve pain and swelling. ? To relax your muscles.  Injections of medicines that help to relieve pain, irritation, and inflammation around the sciatic nerve (steroids).  Surgery.  Follow these instructions at home: Medicines  Take over-the-counter and prescription medicines only as told by your health care provider.  Do not drive or operate heavy machinery while taking prescription pain medicine. Managing pain  If directed, apply ice to the affected area. ? Put ice in a plastic bag. ? Place a towel between your skin and the bag. ? Leave the ice on for 20 minutes, 2-3 times a day.  After icing, apply heat to the affected area before you exercise or as often as told by your health care provider. Use the heat source that your health care provider recommends, such as a moist heat pack or a heating pad. ? Place a towel between your skin and the heat source. ? Leave the heat on for 20-30 minutes. ? Remove the heat if your skin turns bright red. This is especially important if you are unable to feel pain, heat, or cold. You may have a greater risk of getting burned. Activity  Return to your normal activities as told by your health care provider. Ask your health care provider what activities are safe for you. ? Avoid activities that make your symptoms worse.  Take brief periods of rest throughout the day. Resting in a lying or standing position is usually better than sitting to rest. ? When you rest for longer periods, mix in some mild activity or  stretching between periods of rest. This will help to prevent stiffness and pain. ? Avoid sitting for long periods of time without moving. Get up and move around at least one time each hour.  Exercise and stretch regularly, as told by your health care provider.  Do not lift anything that is heavier than 10 lb (4.5 kg) while you have symptoms of sciatica. When you do not have symptoms, you should still avoid heavy lifting, especially repetitive heavy lifting.  When you lift objects, always use proper lifting technique, which includes: ? Bending your knees. ? Keeping the load close to your body. ? Avoiding twisting. General instructions  Use good posture. ? Avoid leaning forward while sitting. ? Avoid hunching over while standing.  Maintain a healthy weight. Excess weight puts extra stress on your back and makes it difficult to maintain good posture.  Wear supportive, comfortable shoes. Avoid wearing high heels.  Avoid sleeping on a mattress that is too soft or too hard. A mattress that is firm enough to support your back when you sleep may help to reduce your pain.  Keep all follow-up visits as told by your health care provider. This is important. Contact a health care provider if:  You have pain that wakes you up when you are sleeping.  You have pain that gets worse when you lie down.  Your pain is worse than you have experienced in the past.  Your pain lasts longer than 4 weeks.  You experience unexplained weight loss. Get help right away if:  You lose control of your bowel or bladder (incontinence).  You have: ?  Weakness in your lower back, pelvis, buttocks, or legs that gets worse. ? Redness or swelling of your back. ? A burning sensation when you urinate. This information is not intended to replace advice given to you by your health care provider. Make sure you discuss any questions you have with your health care provider. Document Released: 04/15/2001 Document  Revised: 09/25/2015 Document Reviewed: 12/29/2014 Elsevier Interactive Patient Education  [employer].    If you have lab work done today you will be contacted with your lab results within the next 2 weeks.  If you have not heard from [place] then please contact us. The fastest way to get your results is to register for My Chart.   IF you received an x-ray today, you will receive an invoice from [facility]. Please contact [facility] at [phone] with questions or concerns regarding your invoice.   IF you received labwork today, you will receive an invoice from [facility]. Please contact [facility] at [phone] with questions or concerns regarding your invoice.   Our billing staff will not be able to assist you with questions regarding bills from these companies.  You will be contacted with the lab results as soon as they are available. The fastest way to get your results is to activate your My Chart account. Instructions are located on the last page of this paperwork. If you have not heard from [place] regarding the results in 2 weeks, please contact this office.       I personally performed the services described in this documentation, which was scribed in my presence. The recorded information has been reviewed and considered for accuracy and completeness, addended by me as needed, and agree with information above.  Signed,   Meredith Staggers, MD Primary [facility] at [facility].  03/30/18 8:58 PM

## 2018-05-03 ENCOUNTER — Ambulatory Visit
Admission: RE | Admit: 2018-05-03 | Discharge: 2018-05-03 | Disposition: A | Discharge status: Home / Self Care | Payer: BLUE CROSS/BLUE SHIELD | Source: Ambulatory Visit | Attending: Family Medicine | Admitting: Family Medicine

## 2018-05-03 ENCOUNTER — Other Ambulatory Visit: Payer: Self-pay

## 2018-05-03 ENCOUNTER — Ambulatory Visit: Payer: BLUE CROSS/BLUE SHIELD | Admitting: Family Medicine

## 2018-05-03 ENCOUNTER — Encounter: Payer: Self-pay | Admitting: Family Medicine

## 2018-05-03 VITALS — BP 128/81 | HR 72 | Temp 98.7°F | Ht 62.0 in | Wt 166.8 lb

## 2018-05-03 DIAGNOSIS — M5441 Lumbago with sciatica, right side: Secondary | ICD-10-CM

## 2018-05-03 MED ORDER — TRAMADOL HCL 50 MG PO TABS: 50.0000 mg | ORAL_TABLET | Freq: Four times a day (QID) | ORAL | 0 refills | Status: AC | PRN

## 2018-05-03 MED ORDER — PREDNISONE 20 MG PO TABS: ORAL_TABLET | ORAL | 0 refills | Status: AC

## 2018-05-03 NOTE — Progress Notes (Addendum)
By signing my name below, I, Schuyler Bain, attest that this documentation has been prepared under the direction and in the presence of Dr. Neva Seat. Electronically Signed: Marcelline Mates, Medical Scribe 05/03/18   Subjective:    Patient ID: Meghan [name], female    DOB: 1976/03/14, 42 y.o.   MRN: [id number] Chief Complaint  Patient presents with  . Sciatica    f/u, and done with mobic- it kinda help   HPI Meghan [name] is a 42 y.o. female who presents to Primary Care at [facility] complaining of sciatica. she was last seen 03/26/18 with right low back pain radiating to right leg, one week prior she had been on Meloxicam 15mg  per day for 5 days only. Suspected sciatica without injury, no red flags. Continued on Meloxicam, started Flexeril, range of motion and stretches.   The pt is accompanied today by her daughter who is helping to translate.  Video interpreter offered but declined.  The pt notes that there is less pain, but her pain continuing. She endorses a numb feeling in her leg, but denies saddle anesthesia or bowel/lbadder incontinence. She notes that she is not sleeping very well at night. The pt describes numbness on the outside of her right calf, through the toes. Continues to have lower back pain radiating into right leg.   The pt notes that her pains began by itself. She denies any fevers, chills, night sweats or unexpected weight loss.   Patient Active Problem List   Diagnosis Date Noted  . Vitamin D deficiency 08/17/2017  . Eczema 08/17/2017   Past Medical History:  Diagnosis Date  . Allergy    No past surgical history on file. No Known Allergies Prior to Admission medications   Medication Sig Start Date End Date Taking? Authorizing Provider  cyclobenzaprine (FLEXERIL) 5 MG tablet 1 pill by mouth up to every 8 hours as needed. Start with one pill by mouth each bedtime as needed due to sedation 03/26/18   Shade Flood, MD  fluticasone [facility] Xxx) 50 MCG/ACT nasal spray  Place 2 sprays into both nostrils daily. 08/17/17   McVey, Madelaine Bhat, PA-C  HYDROcodone-homatropine (HYCODAN) 5-1.5 MG/5ML syrup Take 5 mLs by mouth every 8 (eight) hours as needed for cough. 12/29/17   McVey, Madelaine Bhat, PA-C  meloxicam (MOBIC) 7.5 MG tablet Take 1-2 tablets (7.5-15 mg total) by mouth daily. 10/20/17   McVey, Madelaine Bhat, PA-C  Norethindrone-Ethinyl Estradiol Triphasic (ARANELLE) 0.5/1/0.5-35 MG-MCG tablet Take 1 tablet by mouth daily. 12/29/17   McVey, Madelaine Bhat, PA-C  triamcinolone cream (KENALOG) 0.1 % Apply 1 application topically 2 (two) times daily. 08/17/17   McVey, Madelaine Bhat, PA-C   Social History   Socioeconomic History  . Marital status: Married    Spouse name: Not on file  . Number of children: Not on file  . Years of education: Not on file  . Highest education level: Not on file  Occupational History  . Not on file  Social Needs  . Financial resource strain: Not on file  . Food insecurity:    Worry: Not on file    Inability: Not on file  . Transportation needs:    Medical: Not on file    Non-medical: Not on file  Tobacco Use  . Smoking status: Never Smoker  . Smokeless tobacco: Never Used  Substance and Sexual Activity  . Alcohol use: No  . Drug use: No  . Sexual activity: Yes    Birth control/protection: Pill  Lifestyle  .  Physical activity:    Days per week: Not on file    Minutes per session: Not on file  . Stress: Not on file  Relationships  . Social connections:    Talks on phone: Not on file    Gets together: Not on file    Attends religious service: Not on file    Active member of club or organization: Not on file    Attends meetings of clubs or organizations: Not on file    Relationship status: Not on file  . Intimate partner violence:    Fear of current or ex partner: Not on file    Emotionally abused: Not on file    Physically abused: Not on file    Forced sexual activity: Not on file  Other  Topics Concern  . Not on file  Social History Narrative  . Not on file    Review of Systems  Constitutional: Negative for chills, diaphoresis, fatigue and fever.  Musculoskeletal: Positive for back pain (Lumbar) and myalgias (RLE).  Neurological: Positive for numbness (RLE).      Objective:   Physical Exam Constitutional:      Appearance: Normal appearance.  HENT:     Head: Normocephalic and atraumatic.  Musculoskeletal:     Comments: DP Pulse 2+ right dorsal foot Able to dorsally and plantar flex against resistance with the toes.  Plantar and dorsal flex foot against resistance intact. Right lateral calf through toes and plantar foot numbness with pain radiating from right low back to right leg in a similar distribution.   Lumbar spine exam: Forward flexion 90 degrees, guarded with extension.  Pain with left lateral flexion in to right lower back.  Tender over the right paraspinals to right SI joint, primary to right sciatic notch.  Difficultywith toe walk on the right but able to heel walk without difficulty.  Neurological:     Mental Status: She is alert and oriented to person, place, and time.     Deep Tendon Reflexes: Babinski sign absent on the right side.     Comments: Reflexes 2+ at patella and achilles.  Positive seated straight leg raise    Vitals:   05/03/18 1421  BP: 128/81  Pulse: 72  Temp: 98.7 F (37.1 C)  TempSrc: Oral  SpO2: 99%  Weight: 166 lb 12.8 oz (75.7 kg)  Height: 5\' 2"  (1.575 m)   Dg Lumbar Spine Complete  Result Date: 05/03/2018 CLINICAL DATA:  Low back pain for 1 month EXAM: LUMBAR SPINE - COMPLETE 4+ VIEW COMPARISON:  None. FINDINGS: Transitional anatomy suspected. 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 Lumbar alignment is within normal limits. Vertebral body heights are normal. Mild degenerative changes at L2-L3. IMPRESSION: Mild degenerative change.  No acute osseous abnormality.  Electronically Signed   By: Jasmine PangKim  Fujinaga M.D.   On: 05/03/2018 17:07       Assessment & Plan:    Meghan Altesahani H Gastelum is a 42 y.o. female Right-sided low back pain with right-sided sciatica, unspecified chronicity - Plan: predniSONE (DELTASONE) 20 MG tablet, traMADol (ULTRAM) 50 MG tablet, DG Lumbar Spine Complete Suspected sciatica, persistent after trial of meloxicam and muscle relaxant.   - reassuring x-ray, start prednisone for possible HNP, then MRI or Ortho eval if not improving.   -Tramadol if needed for more severe pain with side effects/risk discussed.   - No current red flags on exam/history, but ER precautions given if acute worsening.  RTC precautions  given.  Understanding expressed  Meds ordered this encounter  Medications  . predniSONE (DELTASONE) 20 MG tablet    Sig: 3 by mouth for 3 days, then 2 by mouth for 2 days, then 1 by mouth for 2 days, then 1/2 by mouth for 2 days.    Dispense:  16 tablet    Refill:  0  . traMADol (ULTRAM) 50 MG tablet    Sig: Take 1 tablet (50 mg total) by mouth every 6 (six) hours as needed.    Dispense:  20 tablet    Refill:  0   Patient Instructions   Start prednisone for possible sciatica or disc issue in the back.  Tramadol if needed for more severe pain.  Do not use over-the-counter ibuprofen, Aleve or other anti-inflammatories while taking prednisone.  Please proceed to imaging facility at [address] for x-ray.  Follow-up in the next week to 10 days, sooner if worse.  If not improving, may need to see orthopedics or have MRI.  Return to the clinic or go to the nearest emergency room if any of your symptoms worsen or new symptoms occur.   Sciatica  Sciatica is pain, numbness, weakness, or tingling along the path of the sciatic nerve. The sciatic nerve starts in the lower back and runs down the back of each leg. The nerve controls the muscles in the lower leg and in the back of the knee. It also provides feeling (sensation) to the back  of the thigh, the lower leg, and the sole of the foot. Sciatica is a symptom of another medical condition that pinches or puts pressure on the sciatic nerve. Generally, sciatica only affects one side of the body. Sciatica usually goes away on its own or with treatment. In some cases, sciatica may keep coming back (recur). What are the causes? This condition is caused by pressure on the sciatic nerve, or pinching of the sciatic nerve. This may be the result of:  A disk in between the bones of the spine (vertebrae) bulging out too far (herniated disk).  Age-related changes in the spinal disks (degenerative disk disease).  A pain disorder that affects a muscle in the buttock (piriformis syndrome).  Extra bone growth (bone spur) near the sciatic nerve.  An injury or break (fracture) of the pelvis.  Pregnancy.  Tumor (rare). What increases the risk? The following factors may make you more likely to develop this condition:  Playing sports that place pressure or stress on the spine, such as football or weight lifting.  Having poor strength and flexibility.  A history of back injury.  A history of back surgery.  Sitting for long periods of time.  Doing activities that involve repetitive bending or lifting.  Obesity. What are the signs or symptoms? Symptoms can vary from mild to very severe, and they may include:  Any of these problems in the lower back, leg, hip, or buttock: ? Mild tingling or dull aches. ? Burning sensations. ? Sharp pains.  Numbness in the back of the calf or the sole of the foot.  Leg weakness.  Severe back pain that makes movement difficult. These symptoms may get worse when you cough, sneeze, or laugh, or when you sit or stand for long periods of time. Being overweight may also make symptoms worse. In some cases, symptoms may recur over time. How is this diagnosed? This condition may be diagnosed based on:  Your symptoms.  A physical exam. Your  health care provider may  ask you to do certain movements to check whether those movements trigger your symptoms.  You may have tests, including: ? Blood tests. ? X-rays. ? MRI. ? CT scan. How is this treated? In many cases, this condition improves on its own, without any treatment. However, treatment may include:  Reducing or modifying physical activity during periods of pain.  Exercising and stretching to strengthen your abdomen and improve the flexibility of your spine.  Icing and applying heat to the affected area.  Medicines that help: ? To relieve pain and swelling. ? To relax your muscles.  Injections of medicines that help to relieve pain, irritation, and inflammation around the sciatic nerve (steroids).  Surgery. Follow these instructions at home: Medicines  Take over-the-counter and prescription medicines only as told by your health care provider.  Do not drive or operate heavy machinery while taking prescription pain medicine. Managing pain  If directed, apply ice to the affected area. ? Put ice in a plastic bag. ? Place a towel between your skin and the bag. ? Leave the ice on for 20 minutes, 2-3 times a day.  After icing, apply heat to the affected area before you exercise or as often as told by your health care provider. Use the heat source that your health care provider recommends, such as a moist heat pack or a heating pad. ? Place a towel between your skin and the heat source. ? Leave the heat on for 20-30 minutes. ? Remove the heat if your skin turns bright red. This is especially important if you are unable to feel pain, heat, or cold. You may have a greater risk of getting burned. Activity  Return to your normal activities as told by your health care provider. Ask your health care provider what activities are safe for you. ? Avoid activities that make your symptoms worse.  Take brief periods of rest throughout the day. Resting in a lying or standing  position is usually better than sitting to rest. ? When you rest for longer periods, mix in some mild activity or stretching between periods of rest. This will help to prevent stiffness and pain. ? Avoid sitting for long periods of time without moving. Get up and move around at least one time each hour.  Exercise and stretch regularly, as told by your health care provider.  Do not lift anything that is heavier than 10 lb (4.5 kg) while you have symptoms of sciatica. When you do not have symptoms, you should still avoid heavy lifting, especially repetitive heavy lifting.  When you lift objects, always use proper lifting technique, which includes: ? Bending your knees. ? Keeping the load close to your body. ? Avoiding twisting. General instructions  Use good posture. ? Avoid leaning forward while sitting. ? Avoid hunching over while standing.  Maintain a healthy weight. Excess weight puts extra stress on your back and makes it difficult to maintain good posture.  Wear supportive, comfortable shoes. Avoid wearing high heels.  Avoid sleeping on a mattress that is too soft or too hard. A mattress that is firm enough to support your back when you sleep may help to reduce your pain.  Keep all follow-up visits as told by your health care provider. This is important. Contact a health care provider if:  You have pain that wakes you up when you are sleeping.  You have pain that gets worse when you lie down.  Your pain is worse than you have experienced in the past.  Your pain lasts longer than 4 weeks.  You experience unexplained weight loss. Get help right away if:  You lose control of your bowel or bladder (incontinence).  You have: ? Weakness in your lower back, pelvis, buttocks, or legs that gets worse. ? Redness or swelling of your back. ? A burning sensation when you urinate. This information is not intended to replace advice given to you by your health care provider. Make sure  you discuss any questions you have with your health care provider. Document Released: 04/15/2001 Document Revised: 09/25/2015 Document Reviewed: 12/29/2014 Elsevier Interactive Patient Education  [employer].    If you have lab work done today you will be contacted with your lab results within the next 2 weeks.  If you have not heard from [place] then please contact us. The fastest way to get your results is to register for My Chart.   IF you received an x-ray today, you will receive an invoice from [facility]. Please contact [facility] at [phone] with questions or concerns regarding your invoice.   IF you received labwork today, you will receive an invoice from [place]. Please contact [facility] at [phone] with questions or concerns regarding your invoice.   Our billing staff will not be able to assist you with questions regarding bills from these companies.  You will be contacted with the lab results as soon as they are available. The fastest way to get your results is to activate your My Chart account. Instructions are located on the last page of this paperwork. If you have not heard from [place] regarding the results in 2 weeks, please contact this office.       I personally performed the services described in this documentation, which was scribed in my presence. The recorded information has been reviewed and considered for accuracy and completeness, addended by me as needed, and agree with information above.  Signed,   Meredith Staggers, MD Primary Care at [facility].  05/05/18 12:37 PM

## 2018-05-03 NOTE — Patient Instructions (Addendum)
Start prednisone for possible sciatica or disc issue in the back.  Tramadol if needed for more severe pain.  Do not use over-the-counter ibuprofen, Aleve or other anti-inflammatories while taking prednisone.  Please proceed to imaging facility at [address] for x-ray.  Follow-up in the next week to 10 days, sooner if worse.  If not improving, may need to see orthopedics or have MRI.  Return to the clinic or go to the nearest emergency room if any of your symptoms worsen or new symptoms occur.   Sciatica  Sciatica is pain, numbness, weakness, or tingling along the path of the sciatic nerve. The sciatic nerve starts in the lower back and runs down the back of each leg. The nerve controls the muscles in the lower leg and in the back of the knee. It also provides feeling (sensation) to the back of the thigh, the lower leg, and the sole of the foot. Sciatica is a symptom of another medical condition that pinches or puts pressure on the sciatic nerve. Generally, sciatica only affects one side of the body. Sciatica usually goes away on its own or with treatment. In some cases, sciatica may keep coming back (recur). What are the causes? This condition is caused by pressure on the sciatic nerve, or pinching of the sciatic nerve. This may be the result of:  A disk in between the bones of the spine (vertebrae) bulging out too far (herniated disk).  Age-related changes in the spinal disks (degenerative disk disease).  A pain disorder that affects a muscle in the buttock (piriformis syndrome).  Extra bone growth (bone spur) near the sciatic nerve.  An injury or break (fracture) of the pelvis.  Pregnancy.  Tumor (rare). What increases the risk? The following factors may make you more likely to develop this condition:  Playing sports that place pressure or stress on the spine, such as football or weight lifting.  Having poor strength and flexibility.  A history of back injury.  A history of  back surgery.  Sitting for long periods of time.  Doing activities that involve repetitive bending or lifting.  Obesity. What are the signs or symptoms? Symptoms can vary from mild to very severe, and they may include:  Any of these problems in the lower back, leg, hip, or buttock: ? Mild tingling or dull aches. ? Burning sensations. ? Sharp pains.  Numbness in the back of the calf or the sole of the foot.  Leg weakness.  Severe back pain that makes movement difficult. These symptoms may get worse when you cough, sneeze, or laugh, or when you sit or stand for long periods of time. Being overweight may also make symptoms worse. In some cases, symptoms may recur over time. How is this diagnosed? This condition may be diagnosed based on:  Your symptoms.  A physical exam. Your health care provider may ask you to do certain movements to check whether those movements trigger your symptoms.  You may have tests, including: ? Blood tests. ? X-rays. ? MRI. ? CT scan. How is this treated? In many cases, this condition improves on its own, without any treatment. However, treatment may include:  Reducing or modifying physical activity during periods of pain.  Exercising and stretching to strengthen your abdomen and improve the flexibility of your spine.  Icing and applying heat to the affected area.  Medicines that help: ? To relieve pain and swelling. ? To relax your muscles.  Injections of medicines that help to relieve  pain, irritation, and inflammation around the sciatic nerve (steroids).  Surgery. Follow these instructions at home: Medicines  Take over-the-counter and prescription medicines only as told by your health care provider.  Do not drive or operate heavy machinery while taking prescription pain medicine. Managing pain  If directed, apply ice to the affected area. ? Put ice in a plastic bag. ? Place a towel between your skin and the bag. ? Leave the ice on  for 20 minutes, 2-3 times a day.  After icing, apply heat to the affected area before you exercise or as often as told by your health care provider. Use the heat source that your health care provider recommends, such as a moist heat pack or a heating pad. ? Place a towel between your skin and the heat source. ? Leave the heat on for 20-30 minutes. ? Remove the heat if your skin turns bright red. This is especially important if you are unable to feel pain, heat, or cold. You may have a greater risk of getting burned. Activity  Return to your normal activities as told by your health care provider. Ask your health care provider what activities are safe for you. ? Avoid activities that make your symptoms worse.  Take brief periods of rest throughout the day. Resting in a lying or standing position is usually better than sitting to rest. ? When you rest for longer periods, mix in some mild activity or stretching between periods of rest. This will help to prevent stiffness and pain. ? Avoid sitting for long periods of time without moving. Get up and move around at least one time each hour.  Exercise and stretch regularly, as told by your health care provider.  Do not lift anything that is heavier than 10 lb (4.5 kg) while you have symptoms of sciatica. When you do not have symptoms, you should still avoid heavy lifting, especially repetitive heavy lifting.  When you lift objects, always use proper lifting technique, which includes: ? Bending your knees. ? Keeping the load close to your body. ? Avoiding twisting. General instructions  Use good posture. ? Avoid leaning forward while sitting. ? Avoid hunching over while standing.  Maintain a healthy weight. Excess weight puts extra stress on your back and makes it difficult to maintain good posture.  Wear supportive, comfortable shoes. Avoid wearing high heels.  Avoid sleeping on a mattress that is too soft or too hard. A mattress that is  firm enough to support your back when you sleep may help to reduce your pain.  Keep all follow-up visits as told by your health care provider. This is important. Contact a health care provider if:  You have pain that wakes you up when you are sleeping.  You have pain that gets worse when you lie down.  Your pain is worse than you have experienced in the past.  Your pain lasts longer than 4 weeks.  You experience unexplained weight loss. Get help right away if:  You lose control of your bowel or bladder (incontinence).  You have: ? Weakness in your lower back, pelvis, buttocks, or legs that gets worse. ? Redness or swelling of your back. ? A burning sensation when you urinate. This information is not intended to replace advice given to you by your health care provider. Make sure you discuss any questions you have with your health care provider. Document Released: 04/15/2001 Document Revised: 09/25/2015 Document Reviewed: 12/29/2014 Elsevier Interactive Patient Education  2019 [employer]  If you have lab work done today you will be contacted with your lab results within the next 2 weeks.  If you have not heard from [place] then please contact us. The fastest way to get your results is to register for My Chart.   IF you received an x-ray today, you will receive an invoice from [facility]. Please contact [facility] at [phone] with questions or concerns regarding your invoice.   IF you received labwork today, you will receive an invoice from [facility]. Please contact [facility] at [phone] with questions or concerns regarding your invoice.   Our billing staff will not be able to assist you with questions regarding bills from these companies.  You will be contacted with the lab results as soon as they are available. The fastest way to get your results is to activate your My Chart account. Instructions are located on the last page of this paperwork. If you  have not heard from [place] regarding the results in 2 weeks, please contact this office.

## 2018-05-05 ENCOUNTER — Encounter: Payer: Self-pay | Admitting: Family Medicine

## 2018-05-10 ENCOUNTER — Encounter: Payer: Self-pay | Admitting: Family Medicine

## 2018-05-10 ENCOUNTER — Ambulatory Visit: Payer: BLUE CROSS/BLUE SHIELD | Admitting: Family Medicine

## 2018-05-10 ENCOUNTER — Other Ambulatory Visit: Payer: Self-pay

## 2018-05-10 VITALS — BP 128/76 | HR 63 | Temp 98.8°F | Resp 20 | Ht 63.66 in | Wt 167.2 lb

## 2018-05-10 DIAGNOSIS — M5441 Lumbago with sciatica, right side: Secondary | ICD-10-CM | POA: Diagnosis not present

## 2018-05-10 DIAGNOSIS — Z23 Encounter for immunization: Secondary | ICD-10-CM

## 2018-05-10 NOTE — Patient Instructions (Addendum)
I am glad to hear the back pain is improving.  I will refer you to physical therapy.  If back pain worsens again, or not improving with physical therapy, let me know and I will refer you to a specialist.  Return to the clinic or go to the nearest emergency room if any of your symptoms worsen or new symptoms occur.   Sciatica  Sciatica is pain, numbness, weakness, or tingling along the path of the sciatic nerve. The sciatic nerve starts in the lower back and runs down the back of each leg. The nerve controls the muscles in the lower leg and in the back of the knee. It also provides feeling (sensation) to the back of the thigh, the lower leg, and the sole of the foot. Sciatica is a symptom of another medical condition that pinches or puts pressure on the sciatic nerve. Generally, sciatica only affects one side of the body. Sciatica usually goes away on its own or with treatment. In some cases, sciatica may keep coming back (recur). What are the causes? This condition is caused by pressure on the sciatic nerve, or pinching of the sciatic nerve. This may be the result of:  A disk in between the bones of the spine (vertebrae) bulging out too far (herniated disk).  Age-related changes in the spinal disks (degenerative disk disease).  A pain disorder that affects a muscle in the buttock (piriformis syndrome).  Extra bone growth (bone spur) near the sciatic nerve.  An injury or break (fracture) of the pelvis.  Pregnancy.  Tumor (rare). What increases the risk? The following factors may make you more likely to develop this condition:  Playing sports that place pressure or stress on the spine, such as football or weight lifting.  Having poor strength and flexibility.  A history of back injury.  A history of back surgery.  Sitting for long periods of time.  Doing activities that involve repetitive bending or lifting.  Obesity. What are the signs or symptoms? Symptoms can vary from mild  to very severe, and they may include:  Any of these problems in the lower back, leg, hip, or buttock: ? Mild tingling or dull aches. ? Burning sensations. ? Sharp pains.  Numbness in the back of the calf or the sole of the foot.  Leg weakness.  Severe back pain that makes movement difficult. These symptoms may get worse when you cough, sneeze, or laugh, or when you sit or stand for long periods of time. Being overweight may also make symptoms worse. In some cases, symptoms may recur over time. How is this diagnosed? This condition may be diagnosed based on:  Your symptoms.  A physical exam. Your health care provider may ask you to do certain movements to check whether those movements trigger your symptoms.  You may have tests, including: ? Blood tests. ? X-rays. ? MRI. ? CT scan. How is this treated? In many cases, this condition improves on its own, without any treatment. However, treatment may include:  Reducing or modifying physical activity during periods of pain.  Exercising and stretching to strengthen your abdomen and improve the flexibility of your spine.  Icing and applying heat to the affected area.  Medicines that help: ? To relieve pain and swelling. ? To relax your muscles.  Injections of medicines that help to relieve pain, irritation, and inflammation around the sciatic nerve (steroids).  Surgery. Follow these instructions at home: Medicines  Take over-the-counter and prescription medicines only as told by  your health care provider.  Do not drive or operate heavy machinery while taking prescription pain medicine. Managing pain  If directed, apply ice to the affected area. ? Put ice in a plastic bag. ? Place a towel between your skin and the bag. ? Leave the ice on for 20 minutes, 2-3 times a day.  After icing, apply heat to the affected area before you exercise or as often as told by your health care provider. Use the heat source that your health  care provider recommends, such as a moist heat pack or a heating pad. ? Place a towel between your skin and the heat source. ? Leave the heat on for 20-30 minutes. ? Remove the heat if your skin turns bright red. This is especially important if you are unable to feel pain, heat, or cold. You may have a greater risk of getting burned. Activity  Return to your normal activities as told by your health care provider. Ask your health care provider what activities are safe for you. ? Avoid activities that make your symptoms worse.  Take brief periods of rest throughout the day. Resting in a lying or standing position is usually better than sitting to rest. ? When you rest for longer periods, mix in some mild activity or stretching between periods of rest. This will help to prevent stiffness and pain. ? Avoid sitting for long periods of time without moving. Get up and move around at least one time each hour.  Exercise and stretch regularly, as told by your health care provider.  Do not lift anything that is heavier than 10 lb (4.5 kg) while you have symptoms of sciatica. When you do not have symptoms, you should still avoid heavy lifting, especially repetitive heavy lifting.  When you lift objects, always use proper lifting technique, which includes: ? Bending your knees. ? Keeping the load close to your body. ? Avoiding twisting. General instructions  Use good posture. ? Avoid leaning forward while sitting. ? Avoid hunching over while standing.  Maintain a healthy weight. Excess weight puts extra stress on your back and makes it difficult to maintain good posture.  Wear supportive, comfortable shoes. Avoid wearing high heels.  Avoid sleeping on a mattress that is too soft or too hard. A mattress that is firm enough to support your back when you sleep may help to reduce your pain.  Keep all follow-up visits as told by your health care provider. This is important. Contact a health care  provider if:  You have pain that wakes you up when you are sleeping.  You have pain that gets worse when you lie down.  Your pain is worse than you have experienced in the past.  Your pain lasts longer than 4 weeks.  You experience unexplained weight loss. Get help right away if:  You lose control of your bowel or bladder (incontinence).  You have: ? Weakness in your lower back, pelvis, buttocks, or legs that gets worse. ? Redness or swelling of your back. ? A burning sensation when you urinate. This information is not intended to replace advice given to you by your health care provider. Make sure you discuss any questions you have with your health care provider. Document Released: 04/15/2001 Document Revised: 09/25/2015 Document Reviewed: 12/29/2014 Elsevier Interactive Patient Education  [employer]2019 [employer]    If you have lab work done today you will be contacted with your lab results within the next 2 weeks.  If you have not heard  from [place] then please contact us. The fastest way to get your results is to register for My Chart.   IF you received an x-ray today, you will receive an invoice from [facility]. Please contact [facility] at [phone] with questions or concerns regarding your invoice.   IF you received labwork today, you will receive an invoice from [place]. Please contact [facility] at [phone] with questions or concerns regarding your invoice.   Our billing staff will not be able to assist you with questions regarding bills from these companies.  You will be contacted with the lab results as soon as they are available. The fastest way to get your results is to activate your My Chart account. Instructions are located on the last page of this paperwork. If you have not heard from [place] regarding the results in 2 weeks, please contact this office.

## 2018-05-10 NOTE — Progress Notes (Signed)
Subjective:    Patient ID: Meghan Meghan [name], Meghan [name]    DOB: 04/26/1976, 43 y.o.   MRN: [id number]  HPI Meghan Meghan [name] Meghan Meghan [name] a 43 y.o. Meghan [name] Presents today for: Chief Complaint  Patient presents with  . Back Pain    f/u- pt states it feel better a little   Here for follow-up of low back pain.  See previous visits.  Most recently seen on December 30.  Suspected sciatica to the right with possible HNP.  Started on tramadol for pain, prednisone.  X-ray without acute findings but did indicate some degenerative changes.  Little better, but still some numbness into R leg. About 60% better. Still on prednisone. Tramadol for 2 days only.   No bowel or bladder incontinence, no saddle anesthesia, no lower extremity weakness.    Patient Active Problem List   Diagnosis Date Noted  . Vitamin D deficiency 08/17/2017  . Eczema 08/17/2017   Past Medical History:  Diagnosis Date  . Allergy    History reviewed. No pertinent surgical history. No Known Allergies Prior to Admission medications   Medication Sig Start Date End Date Taking? Authorizing Provider  fluticasone (FLONASE) 50 MCG/ACT nasal spray Place 2 sprays into both nostrils daily. 08/17/17  Yes McVey, Madelaine BhatElizabeth Whitney, PA-C  Norethindrone-Ethinyl Estradiol Triphasic (ARANELLE) 0.5/1/0.5-35 MG-MCG tablet Take 1 tablet by mouth daily. 12/29/17  Yes McVey, Madelaine BhatElizabeth Whitney, PA-C  predniSONE (DELTASONE) 20 MG tablet 3 by mouth for 3 days, then 2 by mouth for 2 days, then 1 by mouth for 2 days, then 1/2 by mouth for 2 days. 05/03/18  Yes Shade FloodGreene, Renner Sebald R, MD  traMADol (ULTRAM) 50 MG tablet Take 1 tablet (50 mg total) by mouth every 6 (six) hours as needed. 05/03/18  Yes Shade FloodGreene, Mahamud Metts R, MD  triamcinolone cream (KENALOG) 0.1 % Apply 1 application topically 2 (two) times daily. 08/17/17  Yes McVey, Madelaine BhatElizabeth Whitney, PA-C  cyclobenzaprine (FLEXERIL) 5 MG tablet 1 pill by mouth up to every 8 hours as needed. Start with one pill by mouth each  bedtime as needed due to sedation Patient not taking: Reported on 05/03/2018 03/26/18   Shade FloodGreene, Gilberta Peeters R, MD  meloxicam (MOBIC) 7.5 MG tablet Take 1-2 tablets (7.5-15 mg total) by mouth daily. Patient not taking: Reported on 05/03/2018 10/20/17   McVey, Madelaine BhatElizabeth Whitney, PA-C   Social History   Socioeconomic History  . Marital status: Married    Spouse name: Not on file  . Number of children: 4  . Years of education: Not on file  . Highest education level: Not on file  Occupational History  . Not on file  Social Needs  . Financial resource strain: Not on file  . Food insecurity:    Worry: Not on file    Inability: Not on file  . Transportation needs:    Medical: Not on file    Non-medical: Not on file  Tobacco Use  . Smoking status: Never Smoker  . Smokeless tobacco: Never Used  Substance and Sexual Activity  . Alcohol use: No  . Drug use: No  . Sexual activity: Yes    Birth control/protection: Pill  Lifestyle  . Physical activity:    Days per week: Not on file    Minutes per session: Not on file  . Stress: Not on file  Relationships  . Social connections:    Talks on phone: Not on file    Gets together: Not on file    Attends religious service: Not on  file    Active member of club or organization: Not on file    Attends meetings of clubs or organizations: Not on file    Relationship status: Not on file  . Intimate partner violence:    Fear of current or ex partner: Not on file    Emotionally abused: Not on file    Physically abused: Not on file    Forced sexual activity: Not on file  Other Topics Concern  . Not on file  Social History Narrative  . Not on file    Review of Systems Per HPI.     Objective:   Physical Exam Constitutional:      General: She is not in acute distress.    Appearance: She is well-developed.  HENT:     Head: Normocephalic and atraumatic.  Cardiovascular:     Rate and Rhythm: Normal rate.  Pulmonary:     Effort: Pulmonary  effort is normal.  Musculoskeletal:     Lumbar back: She exhibits tenderness (ttp R sciatic notch. ). She exhibits no bony tenderness.     Comments: Negative seated straight leg raise, reflexes 2+ at patella and Achilles, negative Babinski bilaterally.  Neurological:     Mental Status: She is alert and oriented to person, place, and time.    Vitals:   05/10/18 1024  BP: 128/76  Pulse: 63  Resp: 20  Temp: 98.8 F (37.1 C)  TempSrc: Oral  SpO2: 99%  Weight: 167 lb 3.2 oz (75.8 kg)  Height: 5' 3.66" (1.617 m)    Dg Lumbar Spine Complete  Result Date: 05/03/2018 CLINICAL DATA:  Low back pain for 1 month EXAM: LUMBAR SPINE - COMPLETE 4+ VIEW COMPARISON:  None. FINDINGS: Transitional anatomy suspected. 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 Lumbar alignment is within normal limits. Vertebral body heights are normal. Mild degenerative changes at L2-L3. IMPRESSION: Mild degenerative change.  No acute osseous abnormality. Electronically Signed   By: Jasmine PangKim  Fujinaga M.D.   On: 05/03/2018 17:07       Assessment & Plan:   Meghan Meghan [name] is a 43 y.o. Meghan [name] Right-sided low back pain with right-sided sciatica, unspecified chronicity - Plan: Ambulatory referral to Physical Therapy  Need for prophylactic vaccination and inoculation against influenza - Plan: Flu Vaccine QUAD 36+ mos IM Right-sided low back pain with right-sided sciatica, improving with prednisone.  Will refer to physical therapy, finished prednisone, tramadol if needed, can restart over-the-counter ibuprofen if needed after prednisone completed.  If not improving with physical therapy or worsening, refer to orthopedic back specialist.  RTC/ER precautions.  Daughter present translating.  Understanding expressed.   No orders of the defined types were placed in this encounter.  Patient Instructions   I am glad to hear the back pain is improving.  I will refer you to  physical therapy.  If back pain worsens again, or not improving with physical therapy, let me know and I will refer you to a specialist.  Return to the clinic or go to the nearest emergency room if any of your symptoms worsen or new symptoms occur.   Sciatica  Sciatica is pain, numbness, weakness, or tingling along the path of the sciatic nerve. The sciatic nerve starts in the lower back and runs down the back of each leg. The nerve controls the muscles in the lower leg and in the back of the knee. It also provides feeling (sensation) to the back of the thigh,  the lower leg, and the sole of the foot. Sciatica is a symptom of another medical condition that pinches or puts pressure on the sciatic nerve. Generally, sciatica only affects one side of the body. Sciatica usually goes away on its own or with treatment. In some cases, sciatica may keep coming back (recur). What are the causes? This condition is caused by pressure on the sciatic nerve, or pinching of the sciatic nerve. This may be the result of:  A disk in between the bones of the spine (vertebrae) bulging out too far (herniated disk).  Age-related changes in the spinal disks (degenerative disk disease).  A pain disorder that affects a muscle in the buttock (piriformis syndrome).  Extra bone growth (bone spur) near the sciatic nerve.  An injury or break (fracture) of the pelvis.  Pregnancy.  Tumor (rare). What increases the risk? The following factors may make you more likely to develop this condition:  Playing sports that place pressure or stress on the spine, such as football or weight lifting.  Having poor strength and flexibility.  A history of back injury.  A history of back surgery.  Sitting for long periods of time.  Doing activities that involve repetitive bending or lifting.  Obesity. What are the signs or symptoms? Symptoms can vary from mild to very severe, and they may include:  Any of these problems in  the lower back, leg, hip, or buttock: ? Mild tingling or dull aches. ? Burning sensations. ? Sharp pains.  Numbness in the back of the calf or the sole of the foot.  Leg weakness.  Severe back pain that makes movement difficult. These symptoms may get worse when you cough, sneeze, or laugh, or when you sit or stand for long periods of time. Being overweight may also make symptoms worse. In some cases, symptoms may recur over time. How is this diagnosed? This condition may be diagnosed based on:  Your symptoms.  A physical exam. Your health care provider may ask you to do certain movements to check whether those movements trigger your symptoms.  You may have tests, including: ? Blood tests. ? X-rays. ? MRI. ? CT scan. How is this treated? In many cases, this condition improves on its own, without any treatment. However, treatment may include:  Reducing or modifying physical activity during periods of pain.  Exercising and stretching to strengthen your abdomen and improve the flexibility of your spine.  Icing and applying heat to the affected area.  Medicines that help: ? To relieve pain and swelling. ? To relax your muscles.  Injections of medicines that help to relieve pain, irritation, and inflammation around the sciatic nerve (steroids).  Surgery. Follow these instructions at home: Medicines  Take over-the-counter and prescription medicines only as told by your health care provider.  Do not drive or operate heavy machinery while taking prescription pain medicine. Managing pain  If directed, apply ice to the affected area. ? Put ice in a plastic bag. ? Place a towel between your skin and the bag. ? Leave the ice on for 20 minutes, 2-3 times a day.  After icing, apply heat to the affected area before you exercise or as often as told by your health care provider. Use the heat source that your health care provider recommends, such as a moist heat pack or a heating  pad. ? Place a towel between your skin and the heat source. ? Leave the heat on for 20-30 minutes. ? Remove the heat if  your skin turns bright red. This is especially important if you are unable to feel pain, heat, or cold. You may have a greater risk of getting burned. Activity  Return to your normal activities as told by your health care provider. Ask your health care provider what activities are safe for you. ? Avoid activities that make your symptoms worse.  Take brief periods of rest throughout the day. Resting in a lying or standing position is usually better than sitting to rest. ? When you rest for longer periods, mix in some mild activity or stretching between periods of rest. This will help to prevent stiffness and pain. ? Avoid sitting for long periods of time without moving. Get up and move around at least one time each hour.  Exercise and stretch regularly, as told by your health care provider.  Do not lift anything that is heavier than 10 lb (4.5 kg) while you have symptoms of sciatica. When you do not have symptoms, you should still avoid heavy lifting, especially repetitive heavy lifting.  When you lift objects, always use proper lifting technique, which includes: ? Bending your knees. ? Keeping the load close to your body. ? Avoiding twisting. General instructions  Use good posture. ? Avoid leaning forward while sitting. ? Avoid hunching over while standing.  Maintain a healthy weight. Excess weight puts extra stress on your back and makes it difficult to maintain good posture.  Wear supportive, comfortable shoes. Avoid wearing high heels.  Avoid sleeping on a mattress that is too soft or too hard. A mattress that is firm enough to support your back when you sleep may help to reduce your pain.  Keep all follow-up visits as told by your health care provider. This is important. Contact a health care provider if:  You have pain that wakes you up when you are  sleeping.  You have pain that gets worse when you lie down.  Your pain is worse than you have experienced in the past.  Your pain lasts longer than 4 weeks.  You experience unexplained weight loss. Get help right away if:  You lose control of your bowel or bladder (incontinence).  You have: ? Weakness in your lower back, pelvis, buttocks, or legs that gets worse. ? Redness or swelling of your back. ? A burning sensation when you urinate. This information is not intended to replace advice given to you by your health care provider. Make sure you discuss any questions you have with your health care provider. Document Released: 04/15/2001 Document Revised: 09/25/2015 Document Reviewed: 12/29/2014 Elsevier Interactive Patient Education  [employer].    If you have lab work done today you will be contacted with your lab results within the next 2 weeks.  If you have not heard from [place] then please contact us. The fastest way to get your results is to register for My Chart.   IF you received an x-ray today, you will receive an invoice from [facility]. Please contact [facility] at [phone] with questions or concerns regarding your invoice.   IF you received labwork today, you will receive an invoice from [place]. Please contact [facility] at [phone] with questions or concerns regarding your invoice.   Our billing staff will not be able to assist you with questions regarding bills from these companies.  You will be contacted with the lab results as soon as they are available. The fastest way to get your results is to activate your My Chart account. Instructions are  located on the last page of this paperwork. If you have not heard from [place] regarding the results in 2 weeks, please contact this office.      Signed,   Meredith Staggers, MD Primary [facility] at [facility].  05/10/18 11:19 AM

## 2018-05-20 ENCOUNTER — Ambulatory Visit: Payer: BLUE CROSS/BLUE SHIELD | Attending: Family Medicine | Admitting: Physical Therapy

## 2018-05-20 ENCOUNTER — Ambulatory Visit: Payer: Self-pay | Admitting: Emergency Medicine

## 2018-05-20 ENCOUNTER — Other Ambulatory Visit: Payer: Self-pay

## 2018-05-20 ENCOUNTER — Encounter

## 2018-05-20 ENCOUNTER — Encounter: Payer: Self-pay | Admitting: Physical Therapy

## 2018-05-20 DIAGNOSIS — M5441 Lumbago with sciatica, right side: Secondary | ICD-10-CM | POA: Diagnosis present

## 2018-05-20 DIAGNOSIS — M79604 Pain in right leg: Secondary | ICD-10-CM | POA: Diagnosis present

## 2018-05-20 NOTE — Therapy (Signed)
Meghan [name], [place], [address] Phone: [phone]   Fax:  [phone]  Physical Therapy Evaluation  Patient Details  Name: Meghan [name] MRN: [id number] Date of Birth: 1975/07/28 Referring Provider (PT): Wendie Agreste, MD   Encounter Date: 05/20/2018  PT End of Session - 05/20/18 1337    Visit Number  1    Number of Visits  9    Date for PT Re-Evaluation  06/18/18    Authorization Type  BCBS    PT Start Time  1330    PT Stop Time  1418    PT Time Calculation (min)  48 min    Activity Tolerance  Patient tolerated treatment well    Behavior During Therapy  [facility] for tasks assessed/performed       Past Medical History:  Diagnosis Date  . Allergy     History reviewed. No pertinent surgical history.  There were no vitals filed for this visit.   Subjective Assessment - 05/20/18 1350    Subjective  Feels electric down the whole Right leg and numbness. points to Rt SIJ and buttock. 3 mo ago took medications which helped but then it came back. Was working in the kitchen for a long time one day and that was when it started. Tried sciatica exercises from youtube. When I have sat for a long time, I feel it when I stand up.     How long can you sit comfortably?  30 min    How long can you walk comfortably?  at home she is fine, out of the house she will feel it.     Patient Stated Goals  decrease pain, house work    Currently in Pain?  Yes    Pain Score  5     Pain Location  Back    Pain Orientation  Right;Lower    Pain Descriptors / Indicators  --   electricity   Aggravating Factors   standing from sitting    Pain Relieving Factors  unknown         [facility] PT Assessment - 05/20/18 0001      Assessment   Medical Diagnosis  Rt LBP with radicular symptoms    Referring Provider (PT)  Wendie Agreste, MD    Onset Date/Surgical Date  --   3 months   Hand Dominance  Right      Precautions   Precautions  None      Restrictions   Weight Bearing Restrictions  No      Balance Screen   Has the patient fallen in the past 6 months  No      [place] residence    Living Arrangements  Spouse/significant other;Children    Additional Comments  no stairs at home      Prior Function   Level of Ness Requirements  keeps home      Cognition   Overall Cognitive Status  Within Functional Limits for tasks assessed      Observation/Other Assessments   Focus on Therapeutic Outcomes (FOTO)   50% limited   assisted by husband     Sensation   Additional Comments  constant numbness in ball of foot and toes      Posture/Postural Control   Posture Comments  Rt anterior pelvic rotation, sacral sitting      ROM / Strength   AROM / PROM /  Strength  AROM;Strength      AROM   Overall AROM Comments  demo approx 25% due to pain      Strength   Overall Strength Comments  not appropriate at eval due to notable pelvic rotation      Palpation   Palpation comment  Rt SIJ TTP & gross hip musculature TTP on Rt                Objective measurements completed on examination: See above findings.      [facility] Adult PT Treatment/Exercise - 05/20/18 0001      Exercises   Exercises  Lumbar      Lumbar Exercises: Stretches   Lower Trunk Rotation Limitations  bilateral x5      Lumbar Exercises: Supine   Other Supine Lumbar Exercises  hooklying ball squeeze      Manual Therapy   Manual Therapy  Muscle Energy Technique    Muscle Energy Technique  Rt flexors/Lt extensors- iso abd/add             PT Education - 05/20/18 1339    Education Details  anatomy of condition, POC, HEP, exercise form/rationale, FOTO (extended time required due to language barriers)    Person(s) Educated  Patient;Spouse    Methods  Explanation;Demonstration;Tactile cues;Verbal cues;Handout    Comprehension  Verbalized  understanding;Need further instruction;Returned demonstration;Verbal cues required;Tactile cues required          PT Long Term Goals - 05/20/18 1426      PT LONG TERM GOAL #1   Title  Pt will report pain <=2/10 with ADLs    Baseline  5/10 at rest at eval    Time  4    Period  Weeks    Status  New    Target Date  06/18/18      PT LONG TERM GOAL #2   Title  pt will be able to complete household chores without limitation by pain    Baseline  avoiding most of her chores at eval    Time  4    Period  Weeks    Status  New    Target Date  06/18/18      PT LONG TERM GOAL #3   Title  pt will be able to stand to walk without limitation after being seated    Baseline  significant pain upon standing at eval    Time  4    Period  Weeks    Status  New    Target Date  06/18/18      PT LONG TERM GOAL #4   Title  FOTO to 33% limitation    Baseline  50% limited at eval    Time  4    Period  Weeks    Status  New    Target Date  06/18/18             Plan - 05/20/18 1418    Clinical Impression Statement  Pt presents to PT with complaints of R sided LBP and Rt leg pain that began about 3 mo ago after working in the kitchen for a long day. Notable pelvic rotation with functional LLD found and MET utilized to correct. Pt verbalized soreness as expected following correction and was educated on centralizing peripheral pain. Pt will benefit from skilled PT in order to improve lumbopelvic stability and reach long term functional goals.     Clinical Presentation  Stable    Clinical Decision Making  Low    Rehab Potential  Good    PT Frequency  2x / week    PT Duration  4 weeks    PT Treatment/Interventions  ADLs/Self Care Home Management;Cryotherapy;Electrical Stimulation;Ultrasound;Traction;Moist Heat;Iontophoresis 36m/ml Dexamethasone;Therapeutic activities;Therapeutic exercise;Patient/family education;Manual techniques;Passive range of motion;Taping;Dry needling;Spinal  Manipulations;Joint Manipulations    PT Next Visit Plan  check pelvic rotation & progress lumbopelvic stability    PT Home Exercise Plan  seated posture, LTR, hooklying ball squeeze    Consulted and Agree with Plan of Care  Patient;Family member/caregiver    Family Member Consulted  Husband       Patient will benefit from skilled therapeutic intervention in order to improve the following deficits and impairments:  Pain, Improper body mechanics, Postural dysfunction, Increased muscle spasms, Decreased activity tolerance, Decreased range of motion, Decreased strength, Difficulty walking  Visit Diagnosis: Acute right-sided low back pain with right-sided sciatica - Plan: PT plan of care cert/re-cert  Pain in right leg - Plan: PT plan of care cert/re-cert     Problem List Patient Active Problem List   Diagnosis Date Noted  . Vitamin D deficiency 08/17/2017  . Eczema 08/17/2017   Filiberto Wamble C. Maryelizabeth Eberle PT, DPT 05/20/18 3:15 PM   [facility] [address]Phone: [phone]  Fax:  [phone] Name: Meghan MENDOSAMRN: [id number] of Birth: [birth year]

## 2018-06-02 ENCOUNTER — Encounter: Payer: BLUE CROSS/BLUE SHIELD | Admitting: Physical Therapy

## 2018-06-04 ENCOUNTER — Encounter: Payer: Self-pay | Admitting: Physical Therapy

## 2018-06-04 ENCOUNTER — Ambulatory Visit: Payer: BLUE CROSS/BLUE SHIELD | Admitting: Physical Therapy

## 2018-06-04 DIAGNOSIS — M5441 Lumbago with sciatica, right side: Secondary | ICD-10-CM

## 2018-06-04 DIAGNOSIS — M79604 Pain in right leg: Secondary | ICD-10-CM

## 2018-06-04 NOTE — Therapy (Addendum)
[place], [place], [address] Phone: [phone]   Fax:  [phone]  Physical Therapy Treatment/Discharge  Patient Details  Name: Meghan [name] MRN: [id number] Date of Birth: [birth year] Referring Provider (PT): Wendie Agreste, MD   Encounter Date: 06/04/2018  PT End of Session - 06/04/18 0840    Visit Number  2    Date for PT Re-Evaluation  06/18/18    Authorization Type  BCBS    PT Start Time  0841    PT Stop Time  0909    PT Time Calculation (min)  28 min    Activity Tolerance  Patient tolerated treatment well       Past Medical History:  Diagnosis Date  . Allergy     History reviewed. No pertinent surgical history.  There were no vitals filed for this visit.  Subjective Assessment - 06/04/18 0844    Subjective  Pt reports she has not had any pain down her leg or in her back, doing well with HEP     Pertinent History  husband assisted with interpretation    Patient Stated Goals  decrease pain, house work    Currently in Pain?  No/denies         [facility] PT Assessment - 06/04/18 0001      Assessment   Medical Diagnosis  Rt LBP with radicular symptoms    Referring Provider (PT)  Wendie Agreste, MD      Observation/Other Assessments   Focus on Therapeutic Outcomes (FOTO)   20% limited      AROM   AROM Assessment Site  Lumbar   no pain   Lumbar Flexion  WNl    Lumbar Extension  WNL    Lumbar - Right Side Bend  WNL    Lumbar - Left Side Bend  WNL    Lumbar - Right Rotation  WNL    Lumbar - Left Rotation  WNL                   [facility] Adult PT Treatment/Exercise - 06/04/18 0001      Self-Care   Self-Care  Other Self-Care Comments    Other Self-Care Comments   reviewed body mechanics and HEP       Lumbar Exercises: Stretches   Lower Trunk Rotation  4 reps;20 seconds   each side                 PT Long Term Goals - 06/04/18 0846      PT LONG TERM  GOAL #1   Title  Pt will report pain <=2/10 with ADLs    Status  Achieved      PT LONG TERM GOAL #2   Title  pt will be able to complete household chores without limitation by pain    Status  Achieved      PT LONG TERM GOAL #3   Title  pt will be able to stand to walk without limitation after being seated    Status  Achieved      PT LONG TERM GOAL #4   Title  FOTO to 33% limitation    Baseline  20% limited    Status  Achieved            Plan - 06/04/18 0911    Clinical Impression Statement  Pt reports she has had no pain since her last visit.  She has returned to all her prior activities  without any limitations.  All goals met.  She did well with instruction in core stabilization ex.  Will place her on hold for two weeks, if symptoms return she will call to schedule.      PT Next Visit Plan  if no call, discharge in two weeks 06/18/2018    Consulted and Agree with Plan of Care  Patient;Family member/caregiver    Family Member Consulted  Husband       Patient will benefit from skilled therapeutic intervention in order to improve the following deficits and impairments:     Visit Diagnosis: Acute right-sided low back pain with right-sided sciatica  Pain in right leg     Problem List Patient Active Problem List   Diagnosis Date Noted  . Vitamin D deficiency 08/17/2017  . Eczema 08/17/2017    Jeral Pinch PT  06/04/2018, 9:13 AM  [facility] [address] [place], [place], [address] Phone: [phone]   Fax:  [phone]  Name: Meghan [name] MRN: [id number] Date of Birth: 1975/12/17   PHYSICAL THERAPY DISCHARGE SUMMARY  Visits from Start of Care: 2  Current functional level related to goals / functional outcomes: See above for function at last visit   Remaining deficits: None, patient was placed on hold for two weeks and was to call if she needed to return   Education / Equipment: HEP Plan: Patient  agrees to discharge.  Patient goals were partially met. Patient is being discharged due to being pleased with the current functional level.  ?????    Jeral Pinch, PT 07/07/18 11:40 AM

## 2018-06-09 ENCOUNTER — Ambulatory Visit: Payer: BLUE CROSS/BLUE SHIELD | Admitting: Physical Therapy

## 2018-06-11 ENCOUNTER — Ambulatory Visit: Payer: BLUE CROSS/BLUE SHIELD | Admitting: Physical Therapy

## 2018-06-16 ENCOUNTER — Ambulatory Visit: Payer: BLUE CROSS/BLUE SHIELD | Attending: Family Medicine | Admitting: Physical Therapy

## 2018-06-16 ENCOUNTER — Telehealth: Payer: Self-pay | Admitting: Physical Therapy

## 2018-06-16 NOTE — Telephone Encounter (Signed)
Left message regarding missed appointment today. After her last treatment she was asked to call if appointment was not needed.  In message she was informed that she is on the schedule for this Friday and to please call us if she is doing well so we can close out her chart.

## 2018-06-18 ENCOUNTER — Ambulatory Visit: Payer: BLUE CROSS/BLUE SHIELD | Admitting: Physical Therapy

## 2018-06-18 ENCOUNTER — Ambulatory Visit: Payer: BLUE CROSS/BLUE SHIELD | Admitting: Family Medicine

## 2018-08-17 ENCOUNTER — Other Ambulatory Visit: Payer: Self-pay | Admitting: Physician Assistant

## 2018-08-17 DIAGNOSIS — J302 Other seasonal allergic rhinitis: Secondary | ICD-10-CM

## 2018-08-17 NOTE — Telephone Encounter (Signed)
Requested medication (s) are due for refill today - Expired Rx  Requested medication (s) are on the active medication list -yes  Future visit scheduled -no  Last refill: 08/17/17  Notes to clinic: Patient does pass protocol for office visits- but is requesting Rx that is expired- sent for PCP review.  Requested Prescriptions  Pending Prescriptions Disp Refills   fluticasone (FLONASE) 50 MCG/ACT nasal spray [Pharmacy Med Name: FLUTICASONE NASAL SP (120) RX] 16 g 6    Sig: SHAKE LIQUID AND USE 2 SPRAYS IN EACH NOSTRIL DAILY     Ear, Nose, and Throat: Nasal Preparations - Corticosteroids Passed - 08/17/2018  2:56 PM      Passed - Valid encounter within last 12 months    Recent Outpatient Visits          3 months ago Right-sided low back pain with right-sided sciatica, unspecified chronicity   [facility] at [facility], Asencion Partridge, MD   3 months ago Right-sided low back pain with right-sided sciatica, unspecified chronicity   [facility] at [facility], Asencion Partridge, MD   4 months ago Right leg pain   [facility] at [facility], Asencion Partridge, MD   7 months ago Vitamin D deficiency   Primary Care at [facility], [place], PA-C   10 months ago Musculoskeletal back pain   Primary Care at [facility], Madelaine Bhat, [place]              Requested Prescriptions  Pending Prescriptions Disp Refills   fluticasone (FLONASE) 50 MCG/ACT nasal spray [Pharmacy Med Name: FLUTICASONE NASAL SP (120) RX] 16 g 6    Sig: SHAKE LIQUID AND USE 2 SPRAYS IN EACH NOSTRIL DAILY     Ear, Nose, and Throat: Nasal Preparations - Corticosteroids Passed - 08/17/2018  2:56 PM      Passed - Valid encounter within last 12 months    Recent Outpatient Visits          3 months ago Right-sided low back pain with right-sided sciatica, unspecified chronicity   [facility] at [facility], Asencion Partridge, MD   3 months ago Right-sided low back pain with right-sided sciatica, unspecified  chronicity   [facility] at [facility], Asencion Partridge, MD   4 months ago Right leg pain   [facility] at [facility], Asencion Partridge, MD   7 months ago Vitamin D deficiency   Primary Care at [facility] [place], PA-C   10 months ago Musculoskeletal back pain   Primary Care at [facility], Madelaine Bhat, [place]

## 2018-09-11 ENCOUNTER — Other Ambulatory Visit: Payer: Self-pay | Admitting: Family Medicine

## 2018-09-11 DIAGNOSIS — J302 Other seasonal allergic rhinitis: Secondary | ICD-10-CM

## 2018-10-14 NOTE — Telephone Encounter (Signed)
Patient has [employer]	[place]

## 2019-02-04 ENCOUNTER — Other Ambulatory Visit: Payer: Self-pay | Admitting: Physician Assistant

## 2019-02-04 DIAGNOSIS — Z3041 Encounter for surveillance of contraceptive pills: Secondary | ICD-10-CM

## 2019-02-04 NOTE — Telephone Encounter (Signed)
Requested medication (s) are due for refill today: yes  Requested medication (s) are on the active medication list: yes  Last refill:  11/13/2018  Future visit scheduled:no   Notes to clinic:  Review for refill   Requested Prescriptions  Pending Prescriptions Disp Refills   ARANELLE 0.5/1/0.5-35 MG-MCG tablet [Pharmacy Med Name: ARANELLE TABLETS 28] 84 tablet 4    Sig: TAKE 1 TABLET BY MOUTH DAILY     OB/GYN:  Contraceptives Passed - 02/04/2019 12:03 PM      Passed - Last BP in normal range    BP Readings from Last 1 Encounters:  05/10/18 128/76         Passed - Valid encounter within last 12 months    Recent Outpatient Visits          9 months ago Right-sided low back pain with right-sided sciatica, unspecified chronicity   Primary Care at [place], MD   9 months ago Right-sided low back pain with right-sided sciatica, unspecified chronicity   [facility] at [facility], Ranell Patrick, MD   10 months ago Right leg pain   Primary Care at [facility], Ranell Patrick, MD   1 year ago Vitamin D deficiency   Primary Care at [facility], Gelene Mink, PA-C   1 year ago Musculoskeletal back pain   Primary Care at [facility], Gelene Mink, PA-C              [place] 0.5/1/0.5-35 MG-MCG tablet [place] Med Name: Estelle Grumbles TABLETS 28] 84 tablet 4    Sig: TAKE 1 TABLET BY MOUTH DAILY     OB/GYN:  Contraceptives Passed - 02/04/2019 12:03 PM      Passed - Last BP in normal range    BP Readings from Last 1 Encounters:  05/10/18 128/76         Passed - Valid encounter within last 12 months    Recent Outpatient Visits          9 months ago Right-sided low back pain with right-sided sciatica, unspecified chronicity   Primary Care at [place], MD   9 months ago Right-sided low back pain with right-sided sciatica, unspecified chronicity   Primary Care at [facility], Ranell Patrick, MD   10 months ago Right leg pain   Primary Care at [facility], Ranell Patrick, MD   1 year ago Vitamin D deficiency   Primary Care at [facility], Gelene Mink, PA-C   1 year ago Musculoskeletal back pain   Primary Care at [facility], Gelene Mink, [place]

## 2019-02-07 ENCOUNTER — Telehealth: Payer: Self-pay | Admitting: *Deleted

## 2019-02-07 NOTE — Telephone Encounter (Signed)
Please schedule an appointment for patient.  Prescription was sent in will need to be seen before it runs out.   Her previous visit were not associated with her birth control.  Thank You

## 2019-02-10 NOTE — Telephone Encounter (Signed)
Called pt, lvmtcb and schedule appt for med refill

## 2019-05-14 ENCOUNTER — Other Ambulatory Visit: Payer: Self-pay | Admitting: Family Medicine

## 2019-05-14 DIAGNOSIS — Z3041 Encounter for surveillance of contraceptive pills: Secondary | ICD-10-CM

## 2019-05-14 NOTE — Telephone Encounter (Signed)
Requested medication (s) are due for refill today: yes  Requested medication (s) are on the active medication list: yes  Last refill:  02/07/2019  Future visit scheduled:no  Notes to clinic:  no valid encounter within last 12 months Review for refill   Requested Prescriptions  Pending Prescriptions Disp Refills   ARANELLE 0.5/1/0.5-35 MG-MCG tablet [Pharmacy Med Name: ARANELLE TABLETS 28] 84 tablet 0    Sig: TAKE 1 TABLET BY MOUTH DAILY      OB/GYN:  Contraceptives Failed - 05/14/2019  6:37 PM      Failed - Valid encounter within last 12 months    Recent Outpatient Visits           1 year ago Right-sided low back pain with right-sided sciatica, unspecified chronicity   [facility] at [facility], Asencion Partridge, MD   1 year ago Right-sided low back pain with right-sided sciatica, unspecified chronicity   [facility] at [facility], Asencion Partridge, MD   1 year ago Right leg pain   Primary Care at [facility], Asencion Partridge, MD   1 year ago Vitamin D deficiency   Primary Care at [facility], Madelaine Bhat, PA-C   1 year ago Musculoskeletal back pain   Primary Care at [facility], [place], PA-C              Passed - Last BP in normal range    BP Readings from Last 1 Encounters:  05/10/18 128/76
